# Patient Record
Sex: Male | Born: 1945
Health system: Southern US, Community
[De-identification: ages and names within clinical notes are randomized; demographics above are authoritative.]

## PROBLEM LIST (undated history)

## (undated) DIAGNOSIS — I10 Essential (primary) hypertension: Secondary | ICD-10-CM

## (undated) DIAGNOSIS — E785 Hyperlipidemia, unspecified: Secondary | ICD-10-CM

## (undated) DIAGNOSIS — E7800 Pure hypercholesterolemia, unspecified: Secondary | ICD-10-CM

## (undated) DIAGNOSIS — I2510 Atherosclerotic heart disease of native coronary artery without angina pectoris: Secondary | ICD-10-CM

## (undated) DIAGNOSIS — E876 Hypokalemia: Secondary | ICD-10-CM

## (undated) DIAGNOSIS — I213 ST elevation (STEMI) myocardial infarction of unspecified site: Secondary | ICD-10-CM

## (undated) DIAGNOSIS — I255 Ischemic cardiomyopathy: Secondary | ICD-10-CM

## (undated) DIAGNOSIS — Z8619 Personal history of other infectious and parasitic diseases: Secondary | ICD-10-CM

## (undated) HISTORY — DX: Ischemic cardiomyopathy: I25.5

## (undated) HISTORY — PX: CORONARY ANGIOPLASTY WITH STENT PLACEMENT: SHX49

## (undated) HISTORY — DX: Personal history of other infectious and parasitic diseases: Z86.19

## (undated) HISTORY — DX: ST elevation (STEMI) myocardial infarction of unspecified site: I21.3

## (undated) HISTORY — DX: Hyperlipidemia, unspecified: E78.5

## (undated) HISTORY — DX: Hypokalemia: E87.6

---

## 2011-12-17 DIAGNOSIS — I2510 Atherosclerotic heart disease of native coronary artery without angina pectoris: Secondary | ICD-10-CM | POA: Insufficient documentation

## 2011-12-17 DIAGNOSIS — M1990 Unspecified osteoarthritis, unspecified site: Secondary | ICD-10-CM | POA: Insufficient documentation

## 2011-12-17 HISTORY — DX: Atherosclerotic heart disease of native coronary artery without angina pectoris: I25.10

## 2011-12-17 HISTORY — DX: Unspecified osteoarthritis, unspecified site: M19.90

## 2015-10-24 DIAGNOSIS — L309 Dermatitis, unspecified: Secondary | ICD-10-CM

## 2015-10-24 DIAGNOSIS — I10 Essential (primary) hypertension: Secondary | ICD-10-CM

## 2015-10-24 HISTORY — DX: Essential (primary) hypertension: I10

## 2015-10-24 HISTORY — DX: Dermatitis, unspecified: L30.9

## 2016-05-07 DIAGNOSIS — M25562 Pain in left knee: Secondary | ICD-10-CM

## 2016-05-07 HISTORY — DX: Pain in left knee: M25.562

## 2016-06-18 DIAGNOSIS — E785 Hyperlipidemia, unspecified: Secondary | ICD-10-CM

## 2016-06-18 HISTORY — DX: Hyperlipidemia, unspecified: E78.5

## 2017-02-06 ENCOUNTER — Emergency Department (HOSPITAL_BASED_OUTPATIENT_CLINIC_OR_DEPARTMENT_OTHER): Payer: Medicare Other

## 2017-02-06 ENCOUNTER — Inpatient Hospital Stay (HOSPITAL_BASED_OUTPATIENT_CLINIC_OR_DEPARTMENT_OTHER)
Admission: EM | Admit: 2017-02-06 | Discharge: 2017-02-09 | DRG: 250 | Disposition: A | Discharge status: Home / Self Care | Payer: Medicare Other | Attending: Cardiovascular Disease | Admitting: Cardiovascular Disease

## 2017-02-06 ENCOUNTER — Encounter (HOSPITAL_BASED_OUTPATIENT_CLINIC_OR_DEPARTMENT_OTHER): Payer: Self-pay | Admitting: *Deleted

## 2017-02-06 ENCOUNTER — Encounter (HOSPITAL_COMMUNITY)
Admission: EM | Disposition: A | Discharge status: Home / Self Care | Payer: Self-pay | Source: Home / Self Care | Attending: Cardiovascular Disease

## 2017-02-06 DIAGNOSIS — I110 Hypertensive heart disease with heart failure: Secondary | ICD-10-CM | POA: Diagnosis present

## 2017-02-06 DIAGNOSIS — E876 Hypokalemia: Secondary | ICD-10-CM | POA: Diagnosis present

## 2017-02-06 DIAGNOSIS — I2109 ST elevation (STEMI) myocardial infarction involving other coronary artery of anterior wall: Secondary | ICD-10-CM

## 2017-02-06 DIAGNOSIS — I2510 Atherosclerotic heart disease of native coronary artery without angina pectoris: Secondary | ICD-10-CM | POA: Diagnosis present

## 2017-02-06 DIAGNOSIS — Z87891 Personal history of nicotine dependence: Secondary | ICD-10-CM

## 2017-02-06 DIAGNOSIS — I252 Old myocardial infarction: Secondary | ICD-10-CM | POA: Diagnosis not present

## 2017-02-06 DIAGNOSIS — Z9861 Coronary angioplasty status: Secondary | ICD-10-CM

## 2017-02-06 DIAGNOSIS — I2102 ST elevation (STEMI) myocardial infarction involving left anterior descending coronary artery: Principal | ICD-10-CM

## 2017-02-06 DIAGNOSIS — I255 Ischemic cardiomyopathy: Secondary | ICD-10-CM

## 2017-02-06 DIAGNOSIS — I213 ST elevation (STEMI) myocardial infarction of unspecified site: Secondary | ICD-10-CM

## 2017-02-06 DIAGNOSIS — E7800 Pure hypercholesterolemia, unspecified: Secondary | ICD-10-CM | POA: Diagnosis present

## 2017-02-06 DIAGNOSIS — T82855A Stenosis of coronary artery stent, initial encounter: Secondary | ICD-10-CM | POA: Diagnosis present

## 2017-02-06 DIAGNOSIS — E785 Hyperlipidemia, unspecified: Secondary | ICD-10-CM | POA: Diagnosis present

## 2017-02-06 DIAGNOSIS — Y831 Surgical operation with implant of artificial internal device as the cause of abnormal reaction of the patient, or of later complication, without mention of misadventure at the time of the procedure: Secondary | ICD-10-CM | POA: Diagnosis present

## 2017-02-06 DIAGNOSIS — I5021 Acute systolic (congestive) heart failure: Secondary | ICD-10-CM | POA: Diagnosis present

## 2017-02-06 DIAGNOSIS — Z79899 Other long term (current) drug therapy: Secondary | ICD-10-CM

## 2017-02-06 DIAGNOSIS — R079 Chest pain, unspecified: Secondary | ICD-10-CM | POA: Diagnosis not present

## 2017-02-06 DIAGNOSIS — I360 Nonrheumatic tricuspid (valve) stenosis: Secondary | ICD-10-CM | POA: Diagnosis not present

## 2017-02-06 HISTORY — DX: Essential (primary) hypertension: I10

## 2017-02-06 HISTORY — PX: CORONARY BALLOON ANGIOPLASTY: CATH118233

## 2017-02-06 HISTORY — DX: Atherosclerotic heart disease of native coronary artery without angina pectoris: I25.10

## 2017-02-06 HISTORY — DX: Pure hypercholesterolemia, unspecified: E78.00

## 2017-02-06 HISTORY — DX: ST elevation (STEMI) myocardial infarction involving other coronary artery of anterior wall: I21.09

## 2017-02-06 HISTORY — PX: LEFT HEART CATH AND CORONARY ANGIOGRAPHY: CATH118249

## 2017-02-06 LAB — TROPONIN I: Troponin I: 0.06 ng/mL (ref <0.03)

## 2017-02-06 LAB — CBC
HCT: 43.1 % (ref 39.0–52.0)
Hemoglobin: 14.6 g/dL (ref 13.0–17.0)
MCH: 31.5 pg (ref 26.0–34.0)
MCHC: 33.9 g/dL (ref 30.0–36.0)
MCV: 93.1 fL (ref 78.0–100.0)
PLATELETS: 198 10*3/uL (ref 150–400)
RBC: 4.63 MIL/uL (ref 4.22–5.81)
RDW: 13.6 % (ref 11.5–15.5)
WBC: 6 10*3/uL (ref 4.0–10.5)

## 2017-02-06 LAB — POCT ACTIVATED CLOTTING TIME: ACTIVATED CLOTTING TIME: 488 s

## 2017-02-06 LAB — CARDIAC CATHETERIZATION: CATHEFQUANT: 30 %

## 2017-02-06 LAB — BASIC METABOLIC PANEL
Anion gap: 7 (ref 5–15)
BUN: 18 mg/dL (ref 6–20)
CO2: 26 mmol/L (ref 22–32)
CREATININE: 0.78 mg/dL (ref 0.61–1.24)
Calcium: 9.1 mg/dL (ref 8.9–10.3)
Chloride: 106 mmol/L (ref 101–111)
Glucose, Bld: 118 mg/dL — ABNORMAL HIGH (ref 65–99)
POTASSIUM: 3.6 mmol/L (ref 3.5–5.1)
SODIUM: 139 mmol/L (ref 135–145)

## 2017-02-06 SURGERY — LEFT HEART CATH AND CORONARY ANGIOGRAPHY
Anesthesia: LOCAL

## 2017-02-06 MED ORDER — NITROGLYCERIN IN D5W 200-5 MCG/ML-% IV SOLN
0.0000 ug/min | INTRAVENOUS | Status: DC
  Administered 2017-02-06: 10 ug/min via INTRAVENOUS
  Filled 2017-02-06: qty 250

## 2017-02-06 MED ORDER — ATORVASTATIN CALCIUM 80 MG PO TABS
80.0000 mg | ORAL_TABLET | Freq: Every day | ORAL | Status: DC
  Administered 2017-02-06 – 2017-02-08 (×3): 80 mg via ORAL
  Filled 2017-02-06 (×3): qty 1

## 2017-02-06 MED ORDER — HEPARIN SODIUM (PORCINE) 5000 UNIT/ML IJ SOLN
4000.0000 [IU] | Freq: Once | INTRAMUSCULAR | Status: AC
  Filled 2017-02-06: qty 1

## 2017-02-06 MED ORDER — TICAGRELOR 90 MG PO TABS
90.0000 mg | ORAL_TABLET | Freq: Two times a day (BID) | ORAL | Status: DC
  Administered 2017-02-06 – 2017-02-09 (×6): 90 mg via ORAL
  Filled 2017-02-06 (×6): qty 1

## 2017-02-06 MED ORDER — ACETAMINOPHEN 325 MG PO TABS: 650.0000 mg | ORAL_TABLET | ORAL | Status: DC | PRN

## 2017-02-06 MED ORDER — HEPARIN (PORCINE) IN NACL 2-0.9 UNIT/ML-% IJ SOLN
INTRAMUSCULAR | Status: AC
  Filled 2017-02-06: qty 1000

## 2017-02-06 MED ORDER — CARVEDILOL 6.25 MG PO TABS
6.2500 mg | ORAL_TABLET | Freq: Two times a day (BID) | ORAL | Status: DC
  Administered 2017-02-07 (×2): 6.25 mg via ORAL
  Filled 2017-02-06 (×3): qty 1

## 2017-02-06 MED ORDER — IOPAMIDOL (ISOVUE-370) INJECTION 76%
INTRAVENOUS | Status: DC | PRN
  Administered 2017-02-06: 125 mL via INTRA_ARTERIAL

## 2017-02-06 MED ORDER — ASPIRIN 81 MG PO CHEW
324.0000 mg | CHEWABLE_TABLET | Freq: Once | ORAL | Status: AC
  Administered 2017-02-06: 324 mg via ORAL
  Filled 2017-02-06: qty 4

## 2017-02-06 MED ORDER — SODIUM CHLORIDE 0.9% FLUSH: 3.0000 mL | INTRAVENOUS | Status: DC | PRN

## 2017-02-06 MED ORDER — ENOXAPARIN SODIUM 40 MG/0.4ML ~~LOC~~ SOLN
40.0000 mg | SUBCUTANEOUS | Status: DC
  Administered 2017-02-07 – 2017-02-08 (×2): 40 mg via SUBCUTANEOUS
  Filled 2017-02-06 (×2): qty 0.4

## 2017-02-06 MED ORDER — NITROGLYCERIN 1 MG/10 ML FOR IR/CATH LAB
INTRA_ARTERIAL | Status: AC
  Filled 2017-02-06: qty 10

## 2017-02-06 MED ORDER — NITROGLYCERIN 1 MG/10 ML FOR IR/CATH LAB
INTRA_ARTERIAL | Status: DC | PRN
  Administered 2017-02-06: 150 ug via INTRACORONARY
  Administered 2017-02-06: 300 ug via INTRACORONARY

## 2017-02-06 MED ORDER — TICAGRELOR 90 MG PO TABS
ORAL_TABLET | ORAL | Status: AC
  Filled 2017-02-06: qty 1

## 2017-02-06 MED ORDER — LIDOCAINE HCL 1 % IJ SOLN
INTRAMUSCULAR | Status: AC
  Filled 2017-02-06: qty 20

## 2017-02-06 MED ORDER — HEPARIN (PORCINE) IN NACL 2-0.9 UNIT/ML-% IJ SOLN
INTRAMUSCULAR | Status: DC | PRN
  Administered 2017-02-06: 10 mL via INTRA_ARTERIAL

## 2017-02-06 MED ORDER — HEPARIN SODIUM (PORCINE) 1000 UNIT/ML IJ SOLN
INTRAMUSCULAR | Status: AC
  Filled 2017-02-06: qty 1

## 2017-02-06 MED ORDER — HEPARIN (PORCINE) IN NACL 100-0.45 UNIT/ML-% IJ SOLN
14.0000 [IU]/kg/h | Freq: Once | INTRAMUSCULAR | Status: AC
Start: 2017-02-06 — End: 2017-02-06
  Administered 2017-02-06: 14 [IU]/kg/h via INTRAVENOUS
  Filled 2017-02-06: qty 250

## 2017-02-06 MED ORDER — SODIUM CHLORIDE 0.9 % IV SOLN: 250.0000 mL | INTRAVENOUS | Status: DC | PRN

## 2017-02-06 MED ORDER — SODIUM CHLORIDE 0.9 % IV SOLN
INTRAVENOUS | Status: DC
  Administered 2017-02-06: 12:00:00 via INTRAVENOUS

## 2017-02-06 MED ORDER — RAMIPRIL 5 MG PO CAPS
5.0000 mg | ORAL_CAPSULE | Freq: Every day | ORAL | Status: DC
  Administered 2017-02-07 – 2017-02-09 (×3): 5 mg via ORAL
  Filled 2017-02-06 (×3): qty 1

## 2017-02-06 MED ORDER — HEPARIN SODIUM (PORCINE) 1000 UNIT/ML IJ SOLN
INTRAMUSCULAR | Status: DC | PRN
  Administered 2017-02-06: 5000 [IU] via INTRAVENOUS

## 2017-02-06 MED ORDER — LIDOCAINE HCL (PF) 1 % IJ SOLN
INTRAMUSCULAR | Status: DC | PRN
  Administered 2017-02-06: 2 mL

## 2017-02-06 MED ORDER — ASPIRIN 81 MG PO CHEW
81.0000 mg | CHEWABLE_TABLET | Freq: Every day | ORAL | Status: DC
  Administered 2017-02-07 – 2017-02-09 (×3): 81 mg via ORAL
  Filled 2017-02-06 (×3): qty 1

## 2017-02-06 MED ORDER — ONDANSETRON HCL 4 MG/2ML IJ SOLN: 4.0000 mg | Freq: Four times a day (QID) | INTRAMUSCULAR | Status: DC | PRN

## 2017-02-06 MED ORDER — IOPAMIDOL (ISOVUE-370) INJECTION 76%
INTRAVENOUS | Status: AC
  Filled 2017-02-06: qty 100

## 2017-02-06 MED ORDER — TICAGRELOR 90 MG PO TABS
ORAL_TABLET | ORAL | Status: DC | PRN
  Administered 2017-02-06: 180 mg via ORAL

## 2017-02-06 MED ORDER — HEPARIN (PORCINE) IN NACL 2-0.9 UNIT/ML-% IJ SOLN
INTRAMUSCULAR | Status: AC | PRN
  Administered 2017-02-06: 1000 mL

## 2017-02-06 MED ORDER — VERAPAMIL HCL 2.5 MG/ML IV SOLN
INTRAVENOUS | Status: AC
  Filled 2017-02-06: qty 2

## 2017-02-06 MED ORDER — SODIUM CHLORIDE 0.9% FLUSH
3.0000 mL | Freq: Two times a day (BID) | INTRAVENOUS | Status: DC
  Administered 2017-02-07 – 2017-02-08 (×3): 3 mL via INTRAVENOUS

## 2017-02-06 MED ORDER — HEPARIN (PORCINE) IN NACL 100-0.45 UNIT/ML-% IJ SOLN: 1200.0000 [IU]/h | INTRAMUSCULAR | Status: DC

## 2017-02-06 MED ORDER — SODIUM CHLORIDE 0.9 % IV SOLN
INTRAVENOUS | Status: AC
  Administered 2017-02-06: 100 mL/h via INTRAVENOUS

## 2017-02-06 SURGICAL SUPPLY — 16 items
BALLN EUPHORA RX 2.0X12 (BALLOONS) ×2
BALLOON EUPHORA RX 2.0X12 (BALLOONS) ×1 IMPLANT
CATH HEARTRAIL 6F IL4.0 (CATHETERS) ×2 IMPLANT
CATH INFINITI 5FR ANG PIGTAIL (CATHETERS) ×2 IMPLANT
CATH INFINITI JR4 5F (CATHETERS) ×2 IMPLANT
DEVICE RAD COMP TR BAND LRG (VASCULAR PRODUCTS) ×2 IMPLANT
GLIDESHEATH SLEND SS 6F .021 (SHEATH) ×2 IMPLANT
GUIDEWIRE INQWIRE 1.5J.035X260 (WIRE) ×1 IMPLANT
INQWIRE 1.5J .035X260CM (WIRE) ×2
KIT ENCORE 26 ADVANTAGE (KITS) ×2 IMPLANT
KIT HEART LEFT (KITS) ×2 IMPLANT
PACK CARDIAC CATHETERIZATION (CUSTOM PROCEDURE TRAY) ×2 IMPLANT
SYR MEDRAD MARK V 150ML (SYRINGE) ×2 IMPLANT
TRANSDUCER W/STOPCOCK (MISCELLANEOUS) ×2 IMPLANT
TUBING CIL FLEX 10 FLL-RA (TUBING) ×2 IMPLANT
WIRE RUNTHROUGH .014X180CM (WIRE) ×2 IMPLANT

## 2017-02-06 NOTE — Progress Notes (Signed)
ANTICOAGULATION CONSULT NOTE - Initial Consult  Pharmacy Consult for heparin Indication: chest pain/ACS  No Known Allergies  Patient Measurements: Height: 5\' 9"  (175.3 cm) Weight: 190 lb (86.2 kg) IBW/kg (Calculated) : 70.7 Heparin Dosing Weight: 86.2kg  Vital Signs: Temp: 98.1 F (36.7 C) (06/13 1047) Temp Source: Oral (06/13 1047) BP: 199/95 (06/13 1150) Pulse Rate: 63 (06/13 1150)  Labs:  Recent Labs  02/06/17 1050  HGB 14.6  HCT 43.1  PLT 198  CREATININE 0.78  TROPONINI 0.06*    Estimated Creatinine Clearance: 96.1 mL/min (by C-G formula based on SCr of 0.78 mg/dL).   Medical History: Past Medical History:  Diagnosis Date  . Hypercholesteremia   . Hypertension   . MI (myocardial infarction) (HCC)     Medications:  Infusions:  . sodium chloride 75 mL/hr at 02/06/17 1200  . heparin      Assessment: [age] yom presented to the ED with CP. Started on IV heparin per MD and [facility]. Baseline CBC is WNL and he is not on anticoagulation PTA.   Goal of Therapy:  Heparin level 0.3-0.7 units/ml Monitor platelets by anticoagulation protocol: Yes   Plan:  Heparin bolus 4000 units IV x 1 per MD Heparin gtt 1200 units/hr per MD F/u cath vs order 6 hr heparin level  Ulysses Alper, Drake Leachachel Lynn 02/06/2017,12:15 PM

## 2017-02-06 NOTE — ED Notes (Addendum)
EKG completed and given to MD along with pt's presenting complaint and meds taken at home.

## 2017-02-06 NOTE — H&P (Signed)
History & Physical    Patient ID: Joseph Guerra MRN: [id number], DOB/AGE: May 31, 1946   Admit date: 02/06/2017  Primary Physician: Andreas Blower., MD Primary Cardiologist: New to [facility] - Followed by Dr. Bing Matter with [facility]   History of Present Illness    Joseph Guerra is a 71 y.o. male with past medical history of CAD (prior MI in 2001 with stents placed at that time), HTN, and HLD who presents to [facility] as a CODE STEMI.  He had been in his usual state of health until this morning when he developed acute chest discomfort while exercising. He denies any associated dyspnea, nausea, vomiting, or diaphoresis. He took 1 SL NTG with minimal improvement in his symptoms. He took an additional SL NTG around 10:00 and his symptoms persisted, therefore he went to [facility] for further evaluation.   CXR showed mild hyperinflation with no acute abnormalities. CBC with WBC of 6.0, Hgb 14.6, platelets 198. K+ 3.6. Creatinine 0.78. Initial troponin 0.06.  EKG with ST elevation along V4-V5, therefore CODE STEMI was activated and he was transferred to [facility] for an emergent cardiac catheterization. Given a Heparin bolus and started on Heparin drip prior to transfer. Upon arrival to [facility], he is without chest pain.   Past Medical History    Past Medical History:  Diagnosis Date  . CAD (coronary artery disease)    a. prior MI in 2001 with stents placed at that time (patient unsure of which arteries)  . Hypercholesteremia   . Hypertension     Past Surgical History:  Procedure Laterality Date  . CORONARY ANGIOPLASTY WITH STENT PLACEMENT       Allergies  No Known Allergies   Home Medications    Prior to Admission medications   Medication Sig Start Date End Date Taking? Authorizing Provider  atorvastatin (LIPITOR) 80 MG tablet Take 80 mg by mouth daily.   Yes [provider]  folic acid (FOLVITE) 400 MCG tablet Take 400 mcg by mouth daily.    Yes [provider]  metoprolol tartrate (LOPRESSOR) 25 MG tablet Take 25 mg by mouth 1 day or 1 dose.   Yes [provider]  nitroGLYCERIN (NITROSTAT) 0.3 MG SL tablet Place 0.3 mg under the tongue every 5 (five) minutes as needed for chest pain.   Yes [provider]  ramipril (ALTACE) 5 MG capsule Take 5 mg by mouth daily.   Yes [provider]    Family History    No family history on file. - Not obtained due to the acute nature of this encounter.   Social History    Social History   Social History  . Marital status: Married    Spouse name: N/A  . Number of children: N/A  . Years of education: N/A   Occupational History  . Not on file.   Social History Main Topics  . Smoking status: Former Games developer  . Smokeless tobacco: Never Used  . Alcohol use Yes     Comment: occ  . Drug use: No  . Sexual activity: Not on file   Other Topics Concern  . Not on file   Social History Narrative  . No narrative on file     Review of Systems    General:  No chills, fever, night sweats or weight changes.  Cardiovascular:  No dyspnea on exertion, edema, orthopnea, palpitations, paroxysmal nocturnal dyspnea. Positive for chest pain.  Dermatological: No rash, lesions/masses Respiratory:  No cough, dyspnea Urologic: No hematuria, dysuria Abdominal:   No nausea, vomiting, diarrhea, bright red blood per rectum, melena, or hematemesis Neurologic:  No visual changes, wkns, changes in mental status. All other systems reviewed and are otherwise negative except as noted above.  Physical Exam    Blood pressure (!) 164/88, pulse (!) 59, temperature 98.1 F (36.7 C), temperature source Oral, resp. rate (!) 25, height 5\' 9"  (1.753 m), weight 190 lb (86.2 kg), SpO2 98 %.  General: Well developed, well nourished,male in no acute distress. Head: Normocephalic, atraumatic, sclera non-icteric, no xanthomas, nares are without discharge. Dentition:  Neck: No carotid  bruits. JVD not elevated.  Lungs: Respirations regular and unlabored, without wheezes or rales.  Heart: Regular rate and rhythm. No S3 or S4.  No murmur, no rubs, or gallops appreciated. Abdomen: Soft, non-tender, non-distended with normoactive bowel sounds. No hepatomegaly. No rebound/guarding. No obvious abdominal masses. Msk:  Strength and tone appear normal for age. No joint deformities or effusions. Extremities: No clubbing or cyanosis. No edema.  Distal pedal pulses are 2+ bilaterally. Neuro: Alert and oriented X 3. Moves all extremities spontaneously. No focal deficits noted. Psych:  Responds to questions appropriately with a normal affect. Skin: No rashes or lesions noted  Labs    Troponin (Point of Care Test) No results for input(s): TROPIPOC in the last 72 hours.  Recent Labs  02/06/17 1050  TROPONINI 0.06*   Lab Results  Component Value Date   WBC 6.0 02/06/2017   HGB 14.6 02/06/2017   HCT 43.1 02/06/2017   MCV 93.1 02/06/2017   PLT 198 02/06/2017     Recent Labs Lab 02/06/17 1050  NA 139  K 3.6  CL 106  CO2 26  BUN 18  CREATININE 0.78  CALCIUM 9.1  GLUCOSE 118*   No results found for: CHOL, HDL, LDLCALC, TRIG No results found for: DDIMER  No results found for: BNP No results found for: PROBNP No results for input(s): INR in the last 72 hours.    Radiology Studies    Dg Chest 2 View  Result Date: 02/06/2017 CLINICAL DATA:  Onset of chest tightness this morning. Former smoker. History of previous MI. EXAM: CHEST  2 VIEW COMPARISON:  None in PACs FINDINGS: The lungs are well-expanded with mild hemidiaphragm flattening. The heart and pulmonary vascularity are normal. The mediastinum is normal in width. There is no pleural effusion or pneumothorax. There is multilevel degenerative disc disease of the thoracic spine. IMPRESSION: Mild hyperinflation may be voluntary or may reflect the patient's smoking history. No pneumonia, pulmonary edema, nor other acute  cardiopulmonary abnormality. Thoracic spine osteoarthritis. Electronically Signed   By: David  Swazil[place] M.D.   On: 02/06/2017 11:12    EKG & Cardiac Imaging    EKG: NSR, HR 60, with 1mm ST elevation along V4 and V5. - Personally Reviewed  ECHOCARDIOGRAM: None on File  Assessment & Plan    1. Anterolateral STEMI - the patient has a known history of CAD with a prior MI in 2001 (stents placed at that time but he is unsure of which arteries these were placed).  - he developed acute chest discomfort while exercising this morning with no associated dyspnea, nausea, vomiting, or diaphoresis. Minimal relief with initial SL NTG but he is chest pain free upon arrival to [facility].  - initial EKG shows ST elevation along V4-V5, therefore CODE STEMI was activated and he was transferred to [facility] for an emergent cardiac catheterization. Given a Heparin  bolus and started on Heparin drip prior to transfer.  Continue statin and BB therapy. Further recommendations pending his cath results.   2. HLD - on Atorvastatin 80mg  daily as an outpatient. Continue with high-intensity statin therapy.  - recheck FLP.  3. HTN - PTA medications include Lopressor 25mg  BID and Ramipril 5mg  daily.  - continue Lopressor. Hold Ramipril today in the setting of his cardiac catheterization.   Signed, Ellsworth LennoxBrittany M Strader, PA-C 02/06/2017, 1:17 PM Pager: [phone]   Agree with assessment and plan.  I had [facility] and reviewed ECG and recommended STEMI activation and [facility]. Pt has a h/o CAD and remotely had suffered an MI and underwent stenting to his RCA and LAD.  He has been followed by Dr. Bing MatterKrasowski.  Today while exercisng he developed acute chest tightness which persisted. He ultimately presented ~ 2 hrs from CP onset [facility]. ECG reveals NSR with early anterolateral ST elevation in V4-5 with T wave inversion inferiorly. Code STEMI was  activated, recommended iv heparin and NTG.  Plan emergent cath. Pt was on atorvastatin at 40 mg as outpatient.  Will need to be very aggressive with LDL lowering. Reportedly had an echo earlier this year.    Lennette Biharihomas A. Kelly, MD, [facility] 02/06/2017

## 2017-02-06 NOTE — ED Provider Notes (Signed)
[facility] Provider Note   CSN: [id number] Arrival date & time: 02/06/17  1037     History   Chief Complaint Chief Complaint  Patient presents with  . Chest Pain    HPI Joseph Guerra is a 71 y.o. male.  Patient with onset of chest pain this morning at 8:00 substernal some radiation into both arms. Worse pain was 5 out of 10. Patient took 3 nitroglycerin and took his baby aspirin a takes daily some improvement in the pain. Upon arrival here pain was down to 2 out of 10. Patient status post stent and 2002. No longer followed by cardiology. Not followed by cardiology in this area. Patient also has a history of hypertension and high cholesterol. No nausea no vomiting no shortness of breath. No near syncopal episodes.      Past Medical History:  Diagnosis Date  . Hypercholesteremia   . Hypertension   . MI (myocardial infarction) (HCC)     There are no active problems to display for this patient.   Past Surgical History:  Procedure Laterality Date  . CORONARY ANGIOPLASTY WITH STENT PLACEMENT         Home Medications    Prior to Admission medications   Medication Sig Start Date End Date Taking? Authorizing Provider  atorvastatin (LIPITOR) 80 MG tablet Take 80 mg by mouth daily.   Yes [provider]  folic acid (FOLVITE) 400 MCG tablet Take 400 mcg by mouth daily.   Yes [provider]  metoprolol tartrate (LOPRESSOR) 25 MG tablet Take 25 mg by mouth 1 day or 1 dose.   Yes [provider]  nitroGLYCERIN (NITROSTAT) 0.3 MG SL tablet Place 0.3 mg under the tongue every 5 (five) minutes as needed for chest pain.   Yes [provider]  ramipril (ALTACE) 5 MG capsule Take 5 mg by mouth daily.   Yes [provider]    Family History No family history on file.  Social History Social History  Substance Use Topics  . Smoking status: Former Games developer  . Smokeless tobacco: Never Used  . Alcohol use Yes   Comment: occ     Allergies   Patient has no known allergies.   Review of Systems Review of Systems  Constitutional: Negative for fever.  HENT: Negative for congestion.   Eyes: Negative for redness.  Respiratory: Negative for shortness of breath.   Cardiovascular: Positive for chest pain. Negative for leg swelling.  Gastrointestinal: Negative for abdominal pain, blood in stool, nausea and vomiting.  Genitourinary: Negative for hematuria.  Musculoskeletal: Negative for back pain.  Skin: Negative for rash.  Neurological: Negative for syncope and headaches.  Hematological: Does not bruise/bleed easily.  Psychiatric/Behavioral: Negative for confusion.     Physical Exam Updated Vital Signs BP (!) 199/95   Pulse 63   Temp 98.1 F (36.7 C) (Oral)   Resp 15   Ht 1.753 m (5\' 9" )   Wt 86.2 kg (190 lb)   SpO2 97%   BMI 28.06 kg/m   Physical Exam  Constitutional: He is oriented to person, place, and time. He appears well-developed and well-nourished. No distress.  HENT:  Head: Normocephalic and atraumatic.  Mouth/Throat: Oropharynx is clear and moist.  Eyes: Conjunctivae and EOM are normal. Pupils are equal, round, and reactive to light.  Neck: Normal range of motion. Neck supple.  Cardiovascular: Normal rate, regular rhythm and normal heart sounds.   Pulmonary/Chest: Effort normal and breath sounds normal. No respiratory distress.  Abdominal:  Soft. Bowel sounds are normal. There is no tenderness.  Musculoskeletal: Normal range of motion. He exhibits no edema.  Neurological: He is alert and oriented to person, place, and time. No cranial nerve deficit or sensory deficit. He exhibits normal muscle tone. Coordination normal.  Skin: Skin is warm.  Nursing note and vitals reviewed.    ED Treatments / Results  Labs (all labs ordered are listed, but only abnormal results are displayed) Labs Reviewed  BASIC METABOLIC PANEL - Abnormal; Notable for the following:       Result  Value   Glucose, Bld 118 (*)    All other components within normal limits  TROPONIN I - Abnormal; Notable for the following:    Troponin I 0.06 (*)    All other components within normal limits  CBC    EKG  EKG Interpretation  Date/Time:  Wednesday February 06 2017 11:41:36 EDT Ventricular Rate:  60 PR Interval:    QRS Duration: 105 QT Interval:  433 QTC Calculation: 433 R Axis:   93 Text Interpretation:  Sinus rhythm Borderline repolarization abnormality ST elevation, consider anterolateral injury No significant change since last tracing Confirmed by Vanetta Mulders [phone]) on 02/06/2017 11:51:23 AM       Radiology Dg Chest 2 View  Result Date: 02/06/2017 CLINICAL DATA:  Onset of chest tightness this morning. Former smoker. History of previous MI. EXAM: CHEST  2 VIEW COMPARISON:  None in PACs FINDINGS: The lungs are well-expanded with mild hemidiaphragm flattening. The heart and pulmonary vascularity are normal. The mediastinum is normal in width. There is no pleural effusion or pneumothorax. There is multilevel degenerative disc disease of the thoracic spine. IMPRESSION: Mild hyperinflation may be voluntary or may reflect the patient's smoking history. No pneumonia, pulmonary edema, nor other acute cardiopulmonary abnormality. Thoracic spine osteoarthritis. Electronically Signed   By: David  [place]il[place] M.D.   On: 02/06/2017 11:12    Procedures Procedures (including critical care time)  CRITICAL CARE Performed by: Vanetta Mulders Total critical care time: 30 minutes Critical care time was exclusive of separately billable procedures and treating other patients. Critical care was necessary to treat or prevent imminent or life-threatening deterioration. Critical care was time spent personally by me on the following activities: development of treatment plan with patient and/or surrogate as well as nursing, discussions with consultants, evaluation of patient's response to treatment,  examination of patient, obtaining history from patient or surrogate, ordering and performing treatments and interventions, ordering and review of laboratory studies, ordering and review of radiographic studies, pulse oximetry and re-evaluation of patient's condition.   Medications Ordered in ED Medications  0.9 %  sodium chloride infusion ( Intravenous New Bag/Given 02/06/17 1200)  heparin ADULT infusion 100 units/mL (25000 units/210mL sodium chloride 0.45%) (not administered)  aspirin chewable tablet 324 mg (324 mg Oral Given 02/06/17 1142)  heparin injection 4,000 Units (0 Units Intravenous Return to [facility] 02/06/17 1203)  heparin ADULT infusion 100 units/mL (25000 units/266mL sodium chloride 0.45%) (14 Units/kg/hr  86.2 kg Intravenous Rate/Dose Change 02/06/17 1205)     Initial Impression / Assessment and Plan / ED Course  I have reviewed the triage vital signs and the nursing notes.  Pertinent labs & imaging results that were available during my care of the patient were reviewed by me and considered in my medical decision making (see chart for details).     Patient presenting with onset of chest pain at 8 this morning substernal radiating to both arms. At worst 5 out of 10 currently  2 out of 10. No other real symptoms. Patient with a stent in 2002 no history of any chest pain since then. Patient took 3 nitroglycerin at home does take a baby aspirin a day. Some relief of the pain but did not resolve.  EKG here initial one was some PVCs and some concern for anterolateral ST changes. Patient had 2 runs of V. tach here without any change in pressure or any mental status changes or any symptoms. That plus a slightly elevated troponin at 0.06 and repeated the 12-lead basically look the same as the first one but there were no PVCs and that one raise more concern for the V4 V5 leads possibly suggestive of a STEMI.  Discussed with Dr. Tresa EndoKelly on-call for cardiology we went over this situation decided  to call a code STEMI. Discussed with Dr. Herbie BaltimoreHarding who is the Cath Lab physician today. [facility]  Patient started on heparin here. Patient had the rest of his aspirin dose here. Patient states pain is less than 2 out of 10 currently. Vital signs are stable. No V. tach at the moment.  Final Clinical Impressions(s) / ED Diagnoses   Final diagnoses:  ST elevation myocardial infarction (STEMI), unspecified artery [facility](HCC)    New Prescriptions New Prescriptions   No medications on file     Vanetta MuldersZackowski, Duy Lemming, MD 02/06/17 1224

## 2017-02-06 NOTE — ED Triage Notes (Signed)
Pt reports mid-sternal chest pain around 0830 -- reports taking 1 Nitro with no relief; took second Nitro around 1015 with some relief. Denies sob, dizziness, nausea, other associated symptoms. Hx of MI.

## 2017-02-06 NOTE — ED Notes (Signed)
Patient transported to X-ray 

## 2017-02-06 NOTE — ED Notes (Signed)
ED Provider at bedside. 

## 2017-02-06 NOTE — ED Notes (Signed)
Dr Deretha EmoryZackowski called STEM at this time after talking to cardiology

## 2017-02-07 ENCOUNTER — Encounter (HOSPITAL_COMMUNITY): Payer: Self-pay | Admitting: Cardiovascular Disease

## 2017-02-07 ENCOUNTER — Inpatient Hospital Stay (HOSPITAL_COMMUNITY): Payer: Medicare Other

## 2017-02-07 ENCOUNTER — Other Ambulatory Visit (HOSPITAL_COMMUNITY): Payer: Medicare Other

## 2017-02-07 DIAGNOSIS — I360 Nonrheumatic tricuspid (valve) stenosis: Secondary | ICD-10-CM

## 2017-02-07 DIAGNOSIS — I255 Ischemic cardiomyopathy: Secondary | ICD-10-CM

## 2017-02-07 DIAGNOSIS — E785 Hyperlipidemia, unspecified: Secondary | ICD-10-CM

## 2017-02-07 DIAGNOSIS — E876 Hypokalemia: Secondary | ICD-10-CM

## 2017-02-07 LAB — BASIC METABOLIC PANEL
Anion gap: 6 (ref 5–15)
BUN: 10 mg/dL (ref 6–20)
CO2: 25 mmol/L (ref 22–32)
Calcium: 8.4 mg/dL — ABNORMAL LOW (ref 8.9–10.3)
Chloride: 107 mmol/L (ref 101–111)
Creatinine, Ser: 0.66 mg/dL (ref 0.61–1.24)
GFR calc Af Amer: >60 mL/min (ref >60)
GLUCOSE: 101 mg/dL — AB (ref 65–99)
POTASSIUM: 3.3 mmol/L — AB (ref 3.5–5.1)
Sodium: 138 mmol/L (ref 135–145)

## 2017-02-07 LAB — CBC
HEMATOCRIT: 38.8 % — AB (ref 39.0–52.0)
Hemoglobin: 12.7 g/dL — ABNORMAL LOW (ref 13.0–17.0)
MCH: 30.3 pg (ref 26.0–34.0)
MCHC: 32.7 g/dL (ref 30.0–36.0)
MCV: 92.6 fL (ref 78.0–100.0)
Platelets: 174 10*3/uL (ref 150–400)
RBC: 4.19 MIL/uL — ABNORMAL LOW (ref 4.22–5.81)
RDW: 13.6 % (ref 11.5–15.5)
WBC: 8.5 10*3/uL (ref 4.0–10.5)

## 2017-02-07 LAB — ECHOCARDIOGRAM COMPLETE
HEIGHTINCHES: 69 in
Weight: 3079.39 oz

## 2017-02-07 LAB — LIPID PANEL
Cholesterol: 137 mg/dL (ref 0–200)
HDL: 46 mg/dL (ref >40)
LDL CALC: 76 mg/dL (ref 0–99)
TRIGLYCERIDES: 74 mg/dL (ref <150)
Total CHOL/HDL Ratio: 3 RATIO
VLDL: 15 mg/dL (ref 0–40)

## 2017-02-07 LAB — MRSA PCR SCREENING: MRSA BY PCR: NEGATIVE

## 2017-02-07 MED ORDER — POTASSIUM CHLORIDE CRYS ER 20 MEQ PO TBCR
20.0000 meq | EXTENDED_RELEASE_TABLET | Freq: Once | ORAL | Status: AC
  Administered 2017-02-07: 20 meq via ORAL
  Filled 2017-02-07: qty 1

## 2017-02-07 MED ORDER — ISOSORBIDE MONONITRATE ER 30 MG PO TB24
30.0000 mg | ORAL_TABLET | Freq: Every day | ORAL | Status: DC
  Administered 2017-02-07 – 2017-02-09 (×3): 30 mg via ORAL
  Filled 2017-02-07 (×3): qty 1

## 2017-02-07 MED ORDER — SPIRONOLACTONE 25 MG PO TABS
12.5000 mg | ORAL_TABLET | Freq: Two times a day (BID) | ORAL | Status: DC
  Administered 2017-02-07 – 2017-02-09 (×5): 12.5 mg via ORAL
  Filled 2017-02-07 (×5): qty 1

## 2017-02-07 NOTE — Progress Notes (Signed)
Progress Note  Patient Name: Joseph Guerra Date of Encounter: 02/07/2017  Primary Cardiologist: Kas  Subjective   No recurrent chest pain; day 1 s/p  anterolateral STEMI  Inpatient Medications    Scheduled Meds: . aspirin  81 mg Oral Daily  . atorvastatin  80 mg Oral q1800  . carvedilol  6.25 mg Oral BID WC  . enoxaparin (LOVENOX) injection  40 mg Subcutaneous Q24H  . ramipril  5 mg Oral Daily  . sodium chloride flush  3 mL Intravenous Q12H  . ticagrelor  90 mg Oral BID   Continuous Infusions: . sodium chloride 75 mL/hr at 02/07/17 0600  . sodium chloride    . nitroGLYCERIN 50 mcg/min (02/07/17 0800)   PRN Meds: sodium chloride, acetaminophen, ondansetron (ZOFRAN) IV, sodium chloride flush   Vital Signs    Vitals:   02/07/17 0600 02/07/17 0700 02/07/17 0800 02/07/17 0903  BP: 127/62 122/60 (!) 125/59   Pulse: (!) 51 (!) 55 (!) 52   Resp: (!) 22 (!) 21 (!) 22   Temp:    97.8 F (36.6 C)  TempSrc:    Oral  SpO2: 95% 93% 95%   Weight: 192 lb 7.4 oz (87.3 kg)     Height:        Intake/Output Summary (Last 24 hours) at 02/07/17 0928 Last data filed at 02/07/17 0800  Gross per 24 hour  Intake           1535.6 ml  Output             1727 ml  Net           -191.4 ml    I/O since admission: -1914  Filed Weights   02/06/17 1056 02/07/17 0600  Weight: 190 lb (86.2 kg) 192 lb 7.4 oz (87.3 kg)    Telemetry    Sinus Bradycardia 55 - Personally Reviewed  ECG    02/07/2017 ECG (independently read by me): SB at 54; Evolving MI STT changes V4-6 with mild residual ST depression inferiorly; QTc 483  02/06/2017 ECG (independently read by me): NSR 60; STE V4-5 with T wave inversion inferiorly; PVC  Physical Exam   BP (!) 125/59   Pulse (!) 52   Temp 97.8 F (36.6 C) (Oral)   Resp (!) 22   Ht 5\' 9"  (1.753 m)   Wt 192 lb 7.4 oz (87.3 kg)   SpO2 95%   BMI 28.42 kg/m  General: Alert, oriented, no distress.  Skin: normal turgor, no rashes, warm and  dry HEENT: Normocephalic, atraumatic. Pupils equal round and reactive to light; sclera anicteric; extraocular muscles intact Nose without nasal septal hypertrophy Mouth/Parynx benign; Mallinpatti scale 3 Neck: No JVD, no carotid bruits; normal carotid upstroke Lungs: clear to ausculatation and percussion; no wheezing or rales Chest wall: without tenderness to palpitation Heart: PMI not displaced, RRR, s1 s2 normal, 1/6 systolic murmur, no diastolic murmur, no rubs, gallops, thrills, or heaves Abdomen: soft, nontender; no hepatosplenomehaly, BS+; abdominal aorta nontender and not dilated by palpation. Back: no CVA tenderness Pulses 2+; R radial cath site stable Musculoskeletal: full range of motion, normal strength, no joint deformities Extremities: no clubbing cyanosis or edema, Homan's sign negative  Neurologic: grossly nonfocal; Cranial nerves grossly wnl Psychologic: Normal mood and affect   Labs    Chemistry Recent Labs Lab 02/06/17 1050 02/07/17 0236  NA 139 138  K 3.6 3.3*  CL 106 107  CO2 26 25  GLUCOSE 118* 101*  BUN 18 10  CREATININE 0.78 0.66  CALCIUM 9.1 8.4*  GFRNONAA >60 >60  GFRAA >60 >60  ANIONGAP 7 6     Hematology Recent Labs Lab 02/06/17 1050 02/07/17 0236  WBC 6.0 8.5  RBC 4.63 4.19*  HGB 14.6 12.7*  HCT 43.1 38.8*  MCV 93.1 92.6  MCH 31.5 30.3  MCHC 33.9 32.7  RDW 13.6 13.6  PLT 198 174    Cardiac Enzymes Recent Labs Lab 02/06/17 1050  TROPONINI 0.06*   No results for input(s): TROPIPOC in the last 168 hours.   BNPNo results for input(s): BNP, PROBNP in the last 168 hours.   DDimer No results for input(s): DDIMER in the last 168 hours.   Lipid Panel     Component Value Date/Time   CHOL 137 02/07/2017 0236   TRIG 74 02/07/2017 0236   HDL 46 02/07/2017 0236   CHOLHDL 3.0 02/07/2017 0236   VLDL 15 02/07/2017 0236   LDLCALC 76 02/07/2017 0236   Radiology    Dg Chest 2 View  Result Date: 02/06/2017 CLINICAL DATA:  Onset of  chest tightness this morning. Former smoker. History of previous MI. EXAM: CHEST  2 VIEW COMPARISON:  None in PACs FINDINGS: The lungs are well-expanded with mild hemidiaphragm flattening. The heart and pulmonary vascularity are normal. The mediastinum is normal in width. There is no pleural effusion or pneumothorax. There is multilevel degenerative disc disease of the thoracic spine. IMPRESSION: Mild hyperinflation may be voluntary or may reflect the patient's smoking history. No pneumonia, pulmonary edema, nor other acute cardiopulmonary abnormality. Thoracic spine osteoarthritis. Electronically Signed   By: David  SwazilandJordan M.D.   On: 02/06/2017 11:12    Cardiac Studies   Conclusion     Prox Cx lesion, 50 %stenosed.  Dist LAD lesion, 100 %stenosed.  Post intervention, there is a 10% residual stenosis.  Mid LAD lesion, 40 %stenosed.  Prox RCA to Dist RCA lesion, 100 %stenosed.  The left ventricular systolic function is normal.  LV end diastolic pressure is normal.  There is trivial (1+) mitral regurgitation.   1. Severe two-vessel coronary artery disease with patent stent in the mid LAD with moderate in-stent restenosis. The RCA is a full metal jacket and chronically occluded with left-to-right collaterals. The culprit for STEMI is occluded distal LAD close to the apex.  2. Moderately to severely reduced LV systolic function with an EF of 30% with global hypokinesis and apical akinesis. Normal left ventricular end-diastolic pressure.  3. Successful balloon angioplasty of the distal LAD. The diameter is too small and too distal to place a stent.  Recommendations: Dual antiplatelet therapy for at least one year. Aggressive treatment of coronary artery disease and ischemic cardiomyopathy. I switched metoprolol to carvedilol. Uptitrate heart failure medications as tolerated and consider adding eplerenone before hospital discharge.      Patient Profile     71 y.o. male with known  CAD and prior stentint to RCA and proximal LAD transferred from [facility] with anterolateral STEMI.  Assessment & Plan    1. Anterior STEMI 2/2 distal occlusion of LAD, RX with PTCA alone (small vessel).  2. Multivessel CAD: see diagram, had prior RCA and LAD stents inserted in 2002. Total RCA stent with L>R collaterals, 50% Lcx, and 40% instent proximal LAD stenosis.  Will start imdur and wean and dc iv NTG.  On BB with carvedilol/ ASA/brillinta and ACE-I  3. Ischemic cardiomyopathy  EF acutely yesterday 30%.  Reportedly pt had an echo  earlier this  year; will try to obtain outside echo data.  May benefit from aldosterone blockade.  If LV remains low in future possible transition from ACE-I to ARNI with entresto.  4. Hyperlipidemia:  Was on atorvastatin 40 mg PTA; LDL today 76, atorv increased to 80 mg.   5. K 3.3; will replete  Signed, Lennette Bihari, MD, [facility] 02/07/2017, 9:28 AM

## 2017-02-07 NOTE — Progress Notes (Signed)
  Echocardiogram 2D Echocardiogram has been performed.  Joseph Guerra, Joseph Guerra 02/07/2017, 3:38 PM

## 2017-02-07 NOTE — Progress Notes (Signed)
Per [employer] stage managernsurance check for [employer]ilinta # 8  S/W  [place] @ [employer] # [phone]   1; BRILINTA   90 MG BID   CPVER- YES  CO-PAY- $ 37.00  TIET- 3 DRUG  PRIOR APPROVAL NO     PREFERRED PHARMACY : [facility]

## 2017-02-07 NOTE — Plan of Care (Signed)
Problem: Cardiovascular: Goal: Ability to achieve and maintain adequate cardiovascular perfusion will improve Outcome: Progressing Pt hemodynamics with-in normal limits on NTG gtt Goal: Vascular access site(s) Level 0-1 will be maintained Outcome: Progressing Vascular site level 0

## 2017-02-07 NOTE — Care Management Note (Signed)
Case Management Note Joseph PieriniKristi Joelynn Dust RN, BSN Unit 2W-Case Manager-- 2H coverage [phone]  Patient Details  Name: Joseph Guerra MRN: [id number] Date of Birth: Sep 21, 1945  Subjective/Objective:   Pt admitted with STEMI- note pt has been started on Brilinta                 Action/Plan: PTA pt lived at home with spouse- anticipate return home- have submitted insurance check for Brilinta coverage- copay cost $37/mo- info shared with pt and wife at the bedside- pt also given 30 day free card to use on discharge- per pt he uses [facility] in [place] on [address]- will need to call on d/c to check drug in stock.   Expected Discharge Date:                  Expected Discharge Plan:  Home/Self Care  In-House Referral:     Discharge planning Services  CM Consult, Medication Assistance  Post Acute Care Choice:    Choice offered to:     DME Arranged:    DME Agency:     HH Arranged:    HH Agency:     Status of Service:  In process, will continue to follow  If discussed at Long Length of Stay Meetings, dates discussed:    Discharge Disposition:   Additional Comments:  Darrold SpanWebster, [name], RN 02/07/2017, 2:46 PM

## 2017-02-07 NOTE — Progress Notes (Signed)
CARDIAC REHAB PHASE I   PRE:  Rate/Rhythm: 59 SB  BP:  Sitting: 116/60        SaO2: 95 RA  MODE:  Ambulation: 740 ft   POST:  Rate/Rhythm: 80 SR c/ PVCs  BP:  Sitting: 132/63         SaO2: 97 RA  Pt ambulated 740 ft on RA, IV, handheld assist, steady gait, tolerated well with no complaints. Completed MI/PCI education with pt, pt's wife and daughter at bedside.  Reviewed risk factors, MI book, anti-platelet therapy, activity restrictions, ntg, exercise, heart healthy diet and phase 2 cardiac rehab. Pt verbalized understanding, receptive to education. Pt agrees to phase 2 cardiac rehab referral, [facility]. Pt to see case manager regarding brilinta prior to discharge. Pt to recliner after walk, call bell within reach. Will follow up tomorrow.    [phone] Joylene GrapesEmily C Braycen Burandt, RN, BSN 02/07/2017 11:51 AM

## 2017-02-08 ENCOUNTER — Encounter (HOSPITAL_COMMUNITY)
Admission: EM | Disposition: A | Discharge status: Home / Self Care | Payer: Self-pay | Source: Home / Self Care | Attending: Cardiovascular Disease

## 2017-02-08 LAB — BASIC METABOLIC PANEL
ANION GAP: 5 (ref 5–15)
BUN: 12 mg/dL (ref 6–20)
CHLORIDE: 106 mmol/L (ref 101–111)
CO2: 26 mmol/L (ref 22–32)
Calcium: 8.9 mg/dL (ref 8.9–10.3)
Creatinine, Ser: 0.74 mg/dL (ref 0.61–1.24)
GFR calc non Af Amer: >60 mL/min (ref >60)
Glucose, Bld: 105 mg/dL — ABNORMAL HIGH (ref 65–99)
POTASSIUM: 3.9 mmol/L (ref 3.5–5.1)
SODIUM: 137 mmol/L (ref 135–145)

## 2017-02-08 LAB — TROPONIN I
TROPONIN I: 9.19 ng/mL — AB (ref <0.03)
TROPONIN I: 5.86 ng/mL — AB (ref <0.03)
TROPONIN I: 6.61 ng/mL — AB (ref <0.03)

## 2017-02-08 SURGERY — LEFT HEART CATH AND CORONARY ANGIOGRAPHY
Anesthesia: LOCAL

## 2017-02-08 MED ORDER — CARVEDILOL 6.25 MG PO TABS
9.3750 mg | ORAL_TABLET | Freq: Two times a day (BID) | ORAL | Status: DC
  Administered 2017-02-08 – 2017-02-09 (×3): 9.375 mg via ORAL
  Filled 2017-02-08 (×3): qty 1

## 2017-02-08 MED ORDER — CARVEDILOL 6.25 MG PO TABS: 9.3750 mg | ORAL_TABLET | Freq: Two times a day (BID) | ORAL | Status: DC

## 2017-02-08 NOTE — Progress Notes (Signed)
[facility]. Pt and wife oriented to room and equipment. VSS. Telemetry applied, CCMD notified. Pt denies needs at this time. Call bell within reach, will continue to monitor.   Leonidas Rombergaitlin S Bumbledare, RN

## 2017-02-08 NOTE — Progress Notes (Addendum)
Progress Note  Patient Name: Joseph Guerra Date of Encounter: 02/08/2017  Primary Cardiologist: Dr. Bing Matter   Day 2 s/p anterolateral STEMI  Subjective   No chest pain or dyspnea. Laying comfortably in bed. However EKG showed evolving STEMI.   Inpatient Medications    Scheduled Meds: . aspirin  81 mg Oral Daily  . atorvastatin  80 mg Oral q1800  . carvedilol  6.25 mg Oral BID WC  . enoxaparin (LOVENOX) injection  40 mg Subcutaneous Q24H  . isosorbide mononitrate  30 mg Oral Daily  . ramipril  5 mg Oral Daily  . sodium chloride flush  3 mL Intravenous Q12H  . spironolactone  12.5 mg Oral BID  . ticagrelor  90 mg Oral BID   Continuous Infusions: . sodium chloride     PRN Meds: sodium chloride, acetaminophen, ondansetron (ZOFRAN) IV, sodium chloride flush   Vital Signs    Vitals:   02/08/17 0400 02/08/17 0500 02/08/17 0600 02/08/17 0700  BP: (!) 163/78 (!) 159/78 (!) 165/80 (!) 160/80  Pulse: 62 (!) 55  (!) 56  Resp: 18 15 (!) 25 (!) 22  Temp:      TempSrc:      SpO2: 96% 94%  95%  Weight:      Height:        Intake/Output Summary (Last 24 hours) at 02/08/17 0713 Last data filed at 02/08/17 0600  Gross per 24 hour  Intake              975 ml  Output             1750 ml  Net             -775 ml   Filed Weights   02/06/17 1056 02/07/17 0600  Weight: 190 lb (86.2 kg) 192 lb 7.4 oz (87.3 kg)    Telemetry    Sinus rhythm with PACs and PVCs - Personally Reviewed  ECG    EKG showed diffuse st elevation anterior laterally with TWI- Personally Reviewed  Physical Exam   GEN: No acute distress.   Neck: No JVD Cardiac: RRR, no murmurs, rubs, or gallops.  Respiratory: Clear to auscultation bilaterally. GI: Soft, nontender, non-distended  MS: No edema; No deformity. Neuro:  Nonfocal  Psych: Normal affect   Labs    Chemistry Recent Labs Lab 02/06/17 1050 02/07/17 0236 02/08/17 0306  NA 139 138 137  K 3.6 3.3* 3.9  CL 106 107 106  CO2 26  25 26   GLUCOSE 118* 101* 105*  BUN 18 10 12   CREATININE 0.78 0.66 0.74  CALCIUM 9.1 8.4* 8.9  GFRNONAA >60 >60 >60  GFRAA >60 >60 >60  ANIONGAP 7 6 5      Hematology Recent Labs Lab 02/06/17 1050 02/07/17 0236  WBC 6.0 8.5  RBC 4.63 4.19*  HGB 14.6 12.7*  HCT 43.1 38.8*  MCV 93.1 92.6  MCH 31.5 30.3  MCHC 33.9 32.7  RDW 13.6 13.6  PLT 198 174    Cardiac Enzymes Recent Labs Lab 02/06/17 1050  TROPONINI 0.06*   No results for input(s): TROPIPOC in the last 168 hours.   BNPNo results for input(s): BNP, PROBNP in the last 168 hours.   DDimer No results for input(s): DDIMER in the last 168 hours.   Radiology    Dg Chest 2 View  Result Date: 02/06/2017 CLINICAL DATA:  Onset of chest tightness this morning. Former smoker. History of previous MI. EXAM: CHEST  2 VIEW COMPARISON:  None in PACs FINDINGS: The lungs are well-expanded with mild hemidiaphragm flattening. The heart and pulmonary vascularity are normal. The mediastinum is normal in width. There is no pleural effusion or pneumothorax. There is multilevel degenerative disc disease of the thoracic spine. IMPRESSION: Mild hyperinflation may be voluntary or may reflect the patient's smoking history. No pneumonia, pulmonary edema, nor other acute cardiopulmonary abnormality. Thoracic spine osteoarthritis. Electronically Signed   By: David  [place] M.D.   On: 02/06/2017 11:12    Cardiac Studies  Conclusion     Prox Cx lesion, 50 %stenosed.  Dist LAD lesion, 100 %stenosed.  Post intervention, there is a 10% residual stenosis.  Mid LAD lesion, 40 %stenosed.  Prox RCA to Dist RCA lesion, 100 %stenosed.  The left ventricular systolic function is normal.  LV end diastolic pressure is normal.  There is trivial (1+) mitral regurgitation.  1. Severe two-vessel coronary artery disease with patent stent in the mid LAD with moderate in-stent restenosis. The RCA is a full metal jacket and chronically occluded with  left-to-right collaterals. The culprit for STEMI is occluded distal LAD close to the apex.  2. Moderately to severely reduced LV systolic function with an EF of 30% with global hypokinesis and apical akinesis. Normal left ventricular end-diastolic pressure.  3. Successful balloon angioplasty of the distal LAD. The diameter is too small and too distal to place a stent.  Recommendations: Dual antiplatelet therapy for at least one year. Aggressive treatment of coronary artery disease and ischemic cardiomyopathy. I switched metoprolol to carvedilol. Uptitrate heart failure medications as tolerated and consider adding eplerenone before hospital discharge.      Echo 03/09/17 Study Conclusions  - Left ventricle: Distal septal and apical akinesis. The cavity   size was moderately dilated. Wall thickness was increased in a   pattern of mild LVH. The estimated ejection fraction was 40%.   Doppler parameters are consistent with both elevated ventricular   end-diastolic filling pressure and elevated left atrial filling   pressure. - Left atrium: The atrium was mildly dilated. - Atrial septum: No defect or patent foramen ovale was identified.  Patient Profile     71 y.o. male with known CAD and prior stentint to RCA and proximal LAD [facility] from [facility] with anterolateral STEMI.  Assessment & Plan    1. Anterior STEMI - Cath showed distal occlusion of LAD s/p PTCA only. The diameter is too small and too distal to place a stent. - Ambulated well yesterday. EKG this morning showed evolving STEMI. However he is asymptomatic. Case reviewed and discussed with Dr. Herbie Baltimore who also came by to see patient. Plan to continue to observe as lack of chest pain. Will get troponin trend. Heparin per Dr. Tresa Endo.   2. Multivessel CAD with prior RCA and LAD stent - detailed cath as above. Continue Imdur, BB, ACE, statin, ASA and Brillinta.   3. ICM/acute systolic CHF - EF of 30% by  cath. Continue current therapy. If LV remains low in future possible transition from ACE-I to ARNI with entresto.  4. HLD - 02/07/2017: Cholesterol 137; HDL 46; LDL Cholesterol 76; Triglycerides 74; VLDL 15  - continue statin   Signed, Bhagat,Bhavinkumar, PA  02/08/2017, 7:13 AM     Patient seen and examined. Agree with assessment and plan. Day 2 s/p STEMI with PTCA to distal LAD occlusion.  No recurrent chest pain.  ECG today independently read by me shows sinus rhythm with PVC and evolutionary anterolateral T wave changes from  initial STE on 6/13.  ECHO yesterday shows EF 40% with distal septal and apical akinesis, LVH with diastolic dysfunction by tissue doppler. EF improved from cath assessment of 30%. BP elevated at 160/80. Will titrate carvediol to 9.375 mg bid today. Continue ACE-I, nitrates, ASA/Brilinta. Spironolactone was added for aldosterone blockade post MI with reduced LV fxn  Hypokalemia resolved with supplementation.  Will transfer to telemetry today. Cardiac Rehab. Aim for dc tomorrow.   Lennette Biharihomas A. Kelly, MD, [facility] 02/08/2017 7:46 AM

## 2017-02-08 NOTE — Progress Notes (Signed)
CARDIAC REHAB PHASE I   PRE:  Rate/Rhythm: 66 SR  BP:  Sitting: 112/64        SaO2: 96 RA  MODE:  Ambulation: 890 ft   POST:  Rate/Rhythm: 66 SR  BP:  Sitting: 128/67         SaO2: 98 RA  Pt states he is feeling well, no complaints. Daughter at bedside, both states they have no questions regarding education. Pt ambulated 890 ft on RA, brisk, steady gait, tolerated well with no complaints. Encouraged additional ambulation as tolerated. Pt verbalized understanding. Pt to edge of bed per pt request after walk, call bell within reach.  [phone] Joylene GrapesEmily C Tarin Navarez, RN, BSN 02/08/2017 10:55 AM

## 2017-02-08 NOTE — Plan of Care (Signed)
Problem: Activity: Goal: Ability to return to baseline activity level will improve Outcome: Progressing Activity progressing well.  Problem: Cardiovascular: Goal: Ability to achieve and maintain adequate cardiovascular perfusion will improve Outcome: Progressing Within parameters Goal: Vascular access site(s) Level 0-1 will be maintained Outcome: Progressing Level 0  Problem: Education: Goal: Understanding of CV disease, CV risk reduction, and recovery process will improve Outcome: Progressing Education ongoing

## 2017-02-08 NOTE — Progress Notes (Signed)
Critical trop 9.19 called from lab. Pt denies CP. Expected value s/p cath. No new orders given.

## 2017-02-08 NOTE — Progress Notes (Signed)
Critical ECG value relayed to Cards PA. Spoke with, will review.  Currently chest pain free and VVS. Will continue to monitor. Modena JanskyKevin Zeniah Briney RN 2 Heart

## 2017-02-09 LAB — BASIC METABOLIC PANEL
Anion gap: 7 (ref 5–15)
BUN: 14 mg/dL (ref 6–20)
CALCIUM: 8.7 mg/dL — AB (ref 8.9–10.3)
CO2: 25 mmol/L (ref 22–32)
CREATININE: 0.76 mg/dL (ref 0.61–1.24)
Chloride: 105 mmol/L (ref 101–111)
GFR calc Af Amer: >60 mL/min (ref >60)
GLUCOSE: 99 mg/dL (ref 65–99)
Potassium: 3.6 mmol/L (ref 3.5–5.1)
SODIUM: 137 mmol/L (ref 135–145)

## 2017-02-09 MED ORDER — ISOSORBIDE MONONITRATE ER 30 MG PO TB24: 30.0000 mg | ORAL_TABLET | Freq: Every day | ORAL | 6 refills | Status: DC

## 2017-02-09 MED ORDER — CARVEDILOL 12.5 MG PO TABS: 12.5000 mg | ORAL_TABLET | Freq: Two times a day (BID) | ORAL | 11 refills | Status: DC

## 2017-02-09 MED ORDER — ASPIRIN 81 MG PO CHEW: 81.0000 mg | CHEWABLE_TABLET | Freq: Every day | ORAL | 11 refills | Status: DC

## 2017-02-09 MED ORDER — TICAGRELOR 90 MG PO TABS: 90.0000 mg | ORAL_TABLET | Freq: Two times a day (BID) | ORAL | 11 refills | Status: DC

## 2017-02-09 MED ORDER — SPIRONOLACTONE 25 MG PO TABS: 12.5000 mg | ORAL_TABLET | Freq: Two times a day (BID) | ORAL | 6 refills | Status: DC

## 2017-02-09 NOTE — Progress Notes (Signed)
For possible discharge today, awaiting cardiology to evaluate.  Review[facility] with patient and daughter.  Also reviewed NTG usage, angina symptoms, when to call 911, and how to progress activity and exercise at home.  [id number]

## 2017-02-09 NOTE — Discharge Summary (Signed)
Discharge Summary    Patient ID: Joseph Guerra,  MRN: [id number], DOB/AGE: 1946-02-25 71 y.o.  Admit date: 02/06/2017 Discharge date: 02/09/2017  Primary Care Provider: Andreas Blower. Primary Cardiologist: Dr. Bing Matter   Discharge Diagnoses    Active Problems:   ST elevation myocardial infarction (STEMI) (HCC)   ST elevation myocardial infarction (STEMI) of anterolateral wall (HCC)   Hyperlipidemia LDL goal <70   Ischemic cardiomyopathy   Hypokalemia   Allergies No Known Allergies  Diagnostic Studies/Procedures    Conclusion     Prox Cx lesion, 50 %stenosed.  Dist LAD lesion, 100 %stenosed.  Post intervention, there is a 10% residual stenosis.  Mid LAD lesion, 40 %stenosed.  Prox RCA to Dist RCA lesion, 100 %stenosed.  The left ventricular systolic function is normal.  LV end diastolic pressure is normal.  There is trivial (1+) mitral regurgitation.  1. Severe two-vessel coronary artery disease with patent stent in the mid LAD with moderate in-stent restenosis. The RCA is a full metal jacket and chronically occluded with left-to-right collaterals. The culprit for STEMI is occluded distal LAD close to the apex.  2. Moderately to severely reduced LV systolic function with an EF of 30% with global hypokinesis and apical akinesis. Normal left ventricular end-diastolic pressure.  3. Successful balloon angioplasty of the distal LAD. The diameter is too small and too distal to place a stent.  Recommendations: Dual antiplatelet therapy for at least one year. Aggressive treatment of coronary artery disease and ischemic cardiomyopathy. I switched metoprolol to carvedilol. Uptitrate heart failure medications as tolerated and consider adding eplerenone before hospital discharge.      Echo 03/09/17 Study Conclusions  - Left ventricle: Distal septal and apical akinesis. The cavity size was moderately dilated. Wall thickness was increased in  a pattern of mild LVH. The estimated ejection fraction was 40%. Doppler parameters are consistent with both elevated ventricular end-diastolic filling pressure and elevated left atrial filling pressure. - Left atrium: The atrium was mildly dilated. - Atrial septum: No defect or patent foramen ovale was identified.     History of Present Illness     71 y.o.malewith known CAD and prior stenting to RCA and proximal LAD transferred [facility] with anterolateral STEMI.  He had been in his usual state of health until this morning when he developed acute chest discomfort while exercising. He denies any associated dyspnea, nausea, vomiting, or diaphoresis. He took 1 SL NTG with minimal improvement in his symptoms. He took an additional SL NTG around 10:00 and his symptoms persisted, therefore he went to [facility] for further evaluation.   CXR showed mild hyperinflation with no acute abnormalities. CBC with WBC of 6.0, Hgb 14.6, platelets 198. K+ 3.6. Creatinine 0.78. Initial troponin 0.06.  EKG with ST elevation along V4-V5, therefore CODE STEMI was activated and he was transferred to [facility] for an emergent cardiac catheterization. Given a Heparin bolus and started on Heparin drip prior to transfer. Upon arrival to [facility], he is without chest pain.    Hospital Course     Consultants: None  1. Anterior STEMI - Cath showed distal occlusion of LAD s/p PTCA only. The diameter is too small and too distal to place a stent. No chest pain or dyspnea. Laying comfortably in bed. However EKG showed evolving STEMI. No further intervention given lack of symptoms.   2. Multivessel CAD with prior RCA and LAD stent - detailed cath as above. Continue Imdur,  BB, ACE, statin, ASA and Brillinta.   3. ICM/acute systolic CHF - EF of 30% by cath. Continue current therapy. If LV remains low in future possible transition from ACE-I to ARNI with entresto.  4.  HLD - 02/07/2017: Cholesterol 137; HDL 46; LDL Cholesterol 76; Triglycerides 74; VLDL 15  - continue statin  The patient has been seen by Dr.Berry  today and deemed ready for discharge home. All follow-up appointments have been scheduled. Discharge medications are listed below.  _____________   Discharge Vitals Blood pressure (!) 156/70, pulse (!) 58, temperature 98.2 F (36.8 C), temperature source Oral, resp. rate 18, height 5\' 9"  (1.753 m), weight 192 lb 7.4 oz (87.3 kg), SpO2 95 %.  Filed Weights   02/06/17 1056 02/07/17 0600  Weight: 190 lb (86.2 kg) 192 lb 7.4 oz (87.3 kg)    Labs & Radiologic Studies     CBC  Recent Labs  02/07/17 0236  WBC 8.5  HGB 12.7*  HCT 38.8*  MCV 92.6  PLT 174   Basic Metabolic Panel  Recent Labs  02/08/17 0306 02/09/17 0334  NA 137 137  K 3.9 3.6  CL 106 105  CO2 26 25  GLUCOSE 105* 99  BUN 12 14  CREATININE 0.74 0.76  CALCIUM 8.9 8.7*   Liver Function Tests No results for input(s): AST, ALT, ALKPHOS, BILITOT, PROT, ALBUMIN in the last 72 hours. No results for input(s): LIPASE, AMYLASE in the last 72 hours. Cardiac Enzymes  Recent Labs  02/08/17 0751 02/08/17 1243 02/08/17 1825  TROPONINI 9.19* 5.86* 6.61*   BNP Invalid input(s): POCBNP D-Dimer No results for input(s): DDIMER in the last 72 hours. Hemoglobin A1C No results for input(s): HGBA1C in the last 72 hours. Fasting Lipid Panel  Recent Labs  02/07/17 0236  CHOL 137  HDL 46  LDLCALC 76  TRIG 74  CHOLHDL 3.0   Thyroid Function Tests No results for input(s): TSH, T4TOTAL, T3FREE, THYROIDAB in the last 72 hours.  Invalid input(s): FREET3  Dg Chest 2 View  Result Date: 02/06/2017 CLINICAL DATA:  Onset of chest tightness this morning. Former smoker. History of previous MI. EXAM: CHEST  2 VIEW COMPARISON:  None in PACs FINDINGS: The lungs are well-expanded with mild hemidiaphragm flattening. The heart and pulmonary vascularity are normal. The mediastinum  is normal in width. There is no pleural effusion or pneumothorax. There is multilevel degenerative disc disease of the thoracic spine. IMPRESSION: Mild hyperinflation may be voluntary or may reflect the patient's smoking history. No pneumonia, pulmonary edema, nor other acute cardiopulmonary abnormality. Thoracic spine osteoarthritis. Electronically Signed   By: David  [place] M.D.   On: 02/06/2017 11:12    Disposition   Pt is being discharged home today in good condition.  Follow-up Plans & Appointments    Follow-up Information    Dr. Bing Matter Follow up.        Georgeanna Lea., MD Follow up.   Specialty:  Cardiology Why:  office will call with time and date. IF yod did not heard by Tuesday, please give as a call Contact information: [address] [place] [address] [phone]          Discharge Instructions    Amb Referral to Cardiac Rehabilitation    Complete by:  As directed    Diagnosis:   PTCA Comment - to [place] STEMI     Diet - low sodium heart healthy    Complete by:  As directed  Discharge instructions    Complete by:  As directed    No driving for 2 weeks. No lifting over 10 lbs for 4 weeks. No sexual activity for 4 weeks. You may not return to work until cleared by your cardiologist. Keep procedure site clean & dry. If you notice increased pain, swelling, bleeding or pus, call/return!  You may shower, but no soaking baths/hot tubs/pools for 1 week.   Increase activity slowly    Complete by:  As directed       Discharge Medications   Current Discharge Medication List    START taking these medications   Details  aspirin 81 MG chewable tablet Chew 1 tablet (81 mg total) by mouth daily. Qty: 30 tablet, Refills: 11    carvedilol (COREG) 12.5 MG tablet Take 1 tablet (12.5 mg total) by mouth 2 (two) times daily with a meal. Qty: 60 tablet, Refills: 11    isosorbide mononitrate (IMDUR) 30 MG 24 hr tablet Take 1 tablet (30 mg total)  by mouth daily. Qty: 30 tablet, Refills: 6    spironolactone (ALDACTONE) 25 MG tablet Take 0.5 tablets (12.5 mg total) by mouth 2 (two) times daily. Qty: 30 tablet, Refills: 6    ticagrelor (BRILINTA) 90 MG TABS tablet Take 1 tablet (90 mg total) by mouth 2 (two) times daily. Qty: 180 tablet, Refills: 11      CONTINUE these medications which have NOT CHANGED   Details  atorvastatin (LIPITOR) 80 MG tablet Take 80 mg by mouth daily.    folic acid (FOLVITE) 400 MCG tablet Take 400 mcg by mouth daily.    nitroGLYCERIN (NITROSTAT) 0.3 MG SL tablet Place 0.3 mg under the tongue every 5 (five) minutes as needed for chest pain.    ramipril (ALTACE) 5 MG capsule Take 5 mg by mouth daily.      STOP taking these medications     metoprolol succinate (TOPROL-XL) 25 MG 24 hr tablet          Aspirin prescribed at discharge?  Yes High Intensity Statin Prescribed? (Lipitor 40-80mg  or Crestor 20-40mg ): Yes Beta Blocker Prescribed? Yes For EF 45% or less, Was ACEI/ARB Prescribed? Yes ADP Receptor Inhibitor Prescribed? (i.e. Plavix etc.-Includes Medically Managed Patients): Yes For EF <40%, Aldosterone Inhibitor Prescribed? Yes Was EF assessed during THIS hospitalization? Yes Was Cardiac Rehab II ordered? (Included Medically managed Patients): Yes   Outstanding Labs/Studies   None  Duration of Discharge Encounter   Greater than 30 minutes including physician time.  Signed, Lateshia Schmoker PA-C 02/09/2017, 11:41 AM

## 2017-02-09 NOTE — Progress Notes (Signed)
Progress Note  Patient Name: Joseph Guerra Date of Encounter: 02/09/2017  Primary Cardiologist: Dr. Bing MatterKrasowski   Day 2 s/p anterolateral STEMI  Subjective   No chest pain or dyspnea. Laying comfortably in bed. However EKG showed evolving STEMI. Postop day #3 distal LAD angioplasty in setting of acute anterior wall myocardial infarction with known prior CAD.  Inpatient Medications    Scheduled Meds: . aspirin  81 mg Oral Daily  . atorvastatin  80 mg Oral q1800  . carvedilol  9.375 mg Oral BID WC  . enoxaparin (LOVENOX) injection  40 mg Subcutaneous Q24H  . isosorbide mononitrate  30 mg Oral Daily  . ramipril  5 mg Oral Daily  . sodium chloride flush  3 mL Intravenous Q12H  . spironolactone  12.5 mg Oral BID  . ticagrelor  90 mg Oral BID   Continuous Infusions: . sodium chloride     PRN Meds: sodium chloride, acetaminophen, ondansetron (ZOFRAN) IV, sodium chloride flush   Vital Signs    Vitals:   02/08/17 1708 02/08/17 2154 02/09/17 0615 02/09/17 0938  BP:  130/66 (!) 155/76 (!) 156/70  Pulse: 62 (!) 50 (!) 57 (!) 58  Resp:  18 18   Temp:  97.5 F (36.4 C) 98.2 F (36.8 C)   TempSrc:  Oral Oral   SpO2:  97% 95%   Weight:      Height:        Intake/Output Summary (Last 24 hours) at 02/09/17 1134 Last data filed at 02/08/17 1645  Gross per 24 hour  Intake              480 ml  Output                0 ml  Net              480 ml   Filed Weights   02/06/17 1056 02/07/17 0600  Weight: 190 lb (86.2 kg) 192 lb 7.4 oz (87.3 kg)    Telemetry    Sinus rhythm with PACs and PVCs - Personally Reviewed  ECG    Evolving changes of acute anterolateral micropuncture- Personally Reviewed  Physical Exam   GEN: No acute distress.   Neck: No JVD Cardiac: RRR, no murmurs, rubs, or gallops.  Respiratory: Clear to auscultation bilaterally. GI: Soft, nontender, non-distended  MS: No edema; No deformity. Neuro:  Nonfocal  Psych: Normal affect   Labs      Chemistry  Recent Labs Lab 02/07/17 0236 02/08/17 0306 02/09/17 0334  NA 138 137 137  K 3.3* 3.9 3.6  CL 107 106 105  CO2 25 26 25   GLUCOSE 101* 105* 99  BUN 10 12 14   CREATININE 0.66 0.74 0.76  CALCIUM 8.4* 8.9 8.7*  GFRNONAA >60 >60 >60  GFRAA >60 >60 >60  ANIONGAP 6 5 7      Hematology  Recent Labs Lab 02/06/17 1050 02/07/17 0236  WBC 6.0 8.5  RBC 4.63 4.19*  HGB 14.6 12.7*  HCT 43.1 38.8*  MCV 93.1 92.6  MCH 31.5 30.3  MCHC 33.9 32.7  RDW 13.6 13.6  PLT 198 174    Cardiac Enzymes  Recent Labs Lab 02/06/17 1050 02/08/17 0751 02/08/17 1243 02/08/17 1825  TROPONINI 0.06* 9.19* 5.86* 6.61*   No results for input(s): TROPIPOC in the last 168 hours.   BNPNo results for input(s): BNP, PROBNP in the last 168 hours.   DDimer No results for input(s): DDIMER in the last 168 hours.  Radiology    No results found.  Cardiac Studies  Conclusion     Prox Cx lesion, 50 %stenosed.  Dist LAD lesion, 100 %stenosed.  Post intervention, there is a 10% residual stenosis.  Mid LAD lesion, 40 %stenosed.  Prox RCA to Dist RCA lesion, 100 %stenosed.  The left ventricular systolic function is normal.  LV end diastolic pressure is normal.  There is trivial (1+) mitral regurgitation.  1. Severe two-vessel coronary artery disease with patent stent in the mid LAD with moderate in-stent restenosis. The RCA is a full metal jacket and chronically occluded with left-to-right collaterals. The culprit for STEMI is occluded distal LAD close to the apex.  2. Moderately to severely reduced LV systolic function with an EF of 30% with global hypokinesis and apical akinesis. Normal left ventricular end-diastolic pressure.  3. Successful balloon angioplasty of the distal LAD. The diameter is too small and too distal to place a stent.  Recommendations: Dual antiplatelet therapy for at least one year. Aggressive treatment of coronary artery disease and ischemic  cardiomyopathy. I switched metoprolol to carvedilol. Uptitrate heart failure medications as tolerated and consider adding eplerenone before hospital discharge.      Echo 03/09/17 Study Conclusions  - Left ventricle: Distal septal and apical akinesis. The cavity   size was moderately dilated. Wall thickness was increased in a   pattern of mild LVH. The estimated ejection fraction was 40%.   Doppler parameters are consistent with both elevated ventricular   end-diastolic filling pressure and elevated left atrial filling   pressure. - Left atrium: The atrium was mildly dilated. - Atrial septum: No defect or patent foramen ovale was identified.  Patient Profile     71 y.o. male with known CAD and prior stentint to RCA and proximal LAD transferred from [facility] with anterolateral STEMI.  Assessment & Plan    1. Anterior STEMI - Cath showed distal occlusion of LAD s/p PTCA only. The diameter is too small and too distal to place a stent.   2. Multivessel CAD with prior RCA and LAD stent - detailed cath as above. Continue Imdur, BB, ACE, statin, ASA and Brillinta.   3. ICM/acute systolic CHF - EF of 30% by cath. Continue current therapy. If LV remains low in future possible transition from ACE-I to ARNI with entresto.  4. HLD - 02/07/2017: Cholesterol 137; HDL 46; LDL Cholesterol 76; Triglycerides 74; VLDL 15   - continue statin  Patient postop day 3 angioplasty of occluded distal LAD. He does have moderate LV dysfunction on appropriate medications. His EKG shows evolving changes of an anterolateral infarct. He is asymptomatic. His right radial puncture site is healing well. He is stable for discharge home today on dual antiplatelet therapy as outlined. He will follow-up with Dr. Bing Matter in [facility] office the first week of July.  Joseph Sias, MD  02/09/2017, 11:34 AM

## 2017-02-11 ENCOUNTER — Telehealth: Payer: Self-pay

## 2017-02-11 NOTE — Telephone Encounter (Signed)
New message ° ° ° °Pt is returning call to Jennifer. °

## 2017-02-11 NOTE — Telephone Encounter (Signed)
Call placed to Pt for f/u s/p hospitalization.  Call went to VM. Left message with this nurse name and # requesting call back.

## 2017-02-11 NOTE — Telephone Encounter (Signed)
-----   Message from EarlimartBhavinkumar Bhagat, [place]PA sent at 02/09/2017 11:34 AM EDT ----- Please schedule this patient for a follow-up appointment.  Primary Cardiologist: Dr. Bing MatterKrasowski  Date of Discharge: 02/09/2017 Appointment Needed Within: TCM 7-10 Appointment Type:  Will needs follow up in our new [facility]  Thank you! Chelsea AusVin Bhagat, PA-C

## 2017-02-13 NOTE — Telephone Encounter (Signed)
2nd outreach:  Call placed to Pt for f/u s/p hospitalization.  Call went to VM. Left message with this nurse name and # requesting call back.  Left appt information on VM:  Dr. Bing MatterKrasowski @ [facility] 02/25/2017 @ 8:40 am.  Will await call back.

## 2017-02-13 NOTE — Telephone Encounter (Signed)
Follow up ° ° ° °Pt daughter is returning call to RN. °

## 2017-02-13 NOTE — Telephone Encounter (Signed)
[facility]  Patient understands to follow up with provider Dr. Bing MatterKrasowski 02/25/2017 @ 8:40 am @ [facility].  Pt indicates understanding.  Patient understands discharge instructions? Pt denies any educational needs Patient understands medications and regiment? Pt states he does, states he has all his new meds Patient understands to bring all medications to this visit? yes  Pt states he is doing well.  Denies chest pain.  Denies any further educational needs.

## 2017-02-15 ENCOUNTER — Telehealth: Payer: Self-pay | Admitting: Cardiology

## 2017-02-15 NOTE — Telephone Encounter (Signed)
Closed Encounter  °

## 2017-02-25 ENCOUNTER — Ambulatory Visit: Payer: Medicare Other | Admitting: Cardiology

## 2017-02-25 ENCOUNTER — Ambulatory Visit (INDEPENDENT_AMBULATORY_CARE_PROVIDER_SITE_OTHER): Payer: Medicare Other | Admitting: Cardiology

## 2017-02-25 ENCOUNTER — Encounter: Payer: Self-pay | Admitting: Cardiology

## 2017-02-25 DIAGNOSIS — I255 Ischemic cardiomyopathy: Secondary | ICD-10-CM | POA: Diagnosis not present

## 2017-02-25 MED ORDER — NITROGLYCERIN 0.3 MG SL SUBL: 0.3000 mg | SUBLINGUAL_TABLET | SUBLINGUAL | 1 refills | Status: DC | PRN

## 2017-02-25 MED ORDER — RAMIPRIL 5 MG PO CAPS: 5.0000 mg | ORAL_CAPSULE | Freq: Every day | ORAL | 3 refills | Status: DC

## 2017-02-25 NOTE — Progress Notes (Signed)
Cardiology Office Note:    Date:  02/25/2017   ID:  Joseph Guerra, DOB 19-Oct-1945, MRN [id number]  PCP:  Andreas Blowerabeza, Yuri M., MD  Cardiologist:  Gypsy Balsamobert Ralene Gasparyan, MD    Referring MD: Andreas Blowerabeza, Yuri M., MD   Chief Complaint  Patient presents with  . Hospitalization Follow-up  [facility] because of [facility] infarction. He did have complete occlusion of distal LAD which was ballooned. 2 small for stenting. Since that time he's taking great asymptomatic, denies having any chest pain tightness squeezing pressure burning chest.  History of Present Illness:    Joseph ReaperStanislaw Ranker is a 71 y.o. male with a hx of Myocardial infarction, recent cardiac catheterization showing completely occluded right coronary artery, he also has completely occluded distal LAD which was in acute phase of myocardial infarction. It was ballooned. He's doing well now.  Past Medical History:  Diagnosis Date  . CAD (coronary artery disease)    a. prior MI in 2001 with stents placed at that time (patient unsure of which arteries)  . Hypercholesteremia   . Hypertension     Past Surgical History:  Procedure Laterality Date  . CORONARY ANGIOPLASTY WITH STENT PLACEMENT    . CORONARY BALLOON ANGIOPLASTY N/A 02/06/2017   Procedure: Coronary Balloon Angioplasty;  Surgeon: Iran OuchArida, Muhammad A, MD;  Location: [facility] INVASIVE CV LAB;  Service: Cardiovascular;  Laterality: N/A;  . LEFT HEART CATH AND CORONARY ANGIOGRAPHY N/A 02/06/2017   Procedure: Left Heart Cath and Coronary Angiography;  Surgeon: Iran OuchArida, Muhammad A, MD;  [facility];  Service: Cardiovascular;  Laterality: N/A;    Current Medications: Current Meds  Medication Sig  . aspirin 81 MG chewable tablet Chew 1 tablet (81 mg total) by mouth daily.  Marland Kitchen. atorvastatin (LIPITOR) 80 MG tablet Take 80 mg by mouth daily.  . carvedilol (COREG) 12.5 MG tablet Take 1 tablet (12.5 mg total) by mouth 2 (two) times daily with a  meal.  . folic acid (FOLVITE) 400 MCG tablet Take 400 mcg by mouth daily.  . isosorbide mononitrate (IMDUR) 30 MG 24 hr tablet Take 1 tablet (30 mg total) by mouth daily.  . nitroGLYCERIN (NITROSTAT) 0.3 MG SL tablet Place 0.3 mg under the tongue every 5 (five) minutes as needed for chest pain.  . ramipril (ALTACE) 5 MG capsule Take 5 mg by mouth daily.  Marland Kitchen. spironolactone (ALDACTONE) 25 MG tablet Take 0.5 tablets (12.5 mg total) by mouth 2 (two) times daily.  . ticagrelor (BRILINTA) 90 MG TABS tablet Take 1 tablet (90 mg total) by mouth 2 (two) times daily.     Allergies:   Patient has no known allergies.   Social History   Social History  . Marital status: Married    Spouse name: N/A  . Number of children: N/A  . Years of education: N/A   Social History Main Topics  . Smoking status: Former Games developermoker  . Smokeless tobacco: Never Used  . Alcohol use Yes     Comment: occ  . Drug use: No  . Sexual activity: Not Asked   Other Topics Concern  . None   Social History Narrative  . None     Family History: The patient's family history includes Heart disease in his father. ROS:   Please see the history of present illness.     All other systems reviewed and are negative.  EKGs/Labs/Other Studies Reviewed:    The following studies were reviewed today: [facility]  EKG:  EKG is  ordered today.  The ekg ordered today demonstrates   Recent Labs: 02/07/2017: Hemoglobin 12.7; Platelets 174 02/09/2017: BUN 14; Creatinine, Ser 0.76; Potassium 3.6; Sodium 137  Recent Lipid Panel    Component Value Date/Time   CHOL 137 02/07/2017 0236   TRIG 74 02/07/2017 0236   HDL 46 02/07/2017 0236   CHOLHDL 3.0 02/07/2017 0236   VLDL 15 02/07/2017 0236   LDLCALC 76 02/07/2017 0236    Physical Exam:    VS:  BP 100/64   Pulse 72   Resp 16   Ht 5\' 9"  (1.753 m)   Wt 187 lb 12.8 oz (85.2 kg)   BMI 27.73 kg/m     Wt Readings from Last 3 Encounters:  02/25/17  187 lb 12.8 oz (85.2 kg)  02/07/17 192 lb 7.4 oz (87.3 kg)     GEN:  Well nourished, well developed in no acute distress HEENT: Normal NECK: No JVD; No carotid bruits LYMPHATICS: No lymphadenopathy CARDIAC: RRR, no murmurs, rubs, gallops RESPIRATORY:  Clear to auscultation without rales, wheezing or rhonchi  ABDOMEN: Soft, non-tender, non-distended MUSCULOSKELETAL:  No edema; No deformity  SKIN: Warm and dry NEUROLOGIC:  Alert and oriented x 3 PSYCHIATRIC:  Normal affect   ASSESSMENT:    1. Ischemic cardiomyopathy    PLAN:    In order of problems listed above:  1. Coronary artery disease, status post balloon angioplasty of distal LAD. Doing well from that point review I did review all these medications all appropriate and will continue. 2. Ischemic cardio myopathy with ejection fraction of 30%, he's an ACE inhibitor, beta blocker, Aldactone which I will continue. I will check his chem 7 today to check electrolytes. The goal will be hopefully able to increase his dose of Altace. However, his blood pressure being low. 3. Dyslipidemia: He needs to be a high intensity statin because of his coronary artery disease. His weight on 80 mg of Lipitor which I will do. We'll check his fasting profile to see if therapy sufficient. 4. Overall feeling well cardiac-wise right now. I will ask him to have echocardiogram done in about 2 months to see if there is any improvement of left ventricular ejection fraction, if no will consider ICD.   Medication Adjustments/Labs and Tests Ordered: Current medicines are reviewed at length with the patient today.  Concerns regarding medicines are outlined above.  No orders of the defined types were placed in this encounter.  No orders of the defined types were placed in this encounter.   Signed, Gypsy Balsam, MD  02/25/2017 10:45 AM    [facility]

## 2017-02-25 NOTE — Patient Instructions (Signed)
Medication Instructions:  Your physician recommends that you continue on your current medications as directed. Please refer to the Current Medication list given to you today.  Labwork: Your physician recommends that you return for lab work in: 3 weeks and Lipids today  Testing/Procedures: none  Follow-Up: Your physician recommends that you schedule a follow-up appointment in: 1 month    Any Other Special Instructions Will Be Listed Below (If Applicable).     If you need a refill on your cardiac medications before your next appointment, please call your pharmacy.

## 2017-02-26 LAB — BASIC METABOLIC PANEL
BUN / CREAT RATIO: 18 (ref 10–24)
BUN: 17 mg/dL (ref 8–27)
CO2: 23 mmol/L (ref 20–29)
CREATININE: 0.92 mg/dL (ref 0.76–1.27)
Calcium: 9.5 mg/dL (ref 8.6–10.2)
Chloride: 101 mmol/L (ref 96–106)
GFR calc Af Amer: 98 mL/min/{1.73_m2} (ref >59)
GFR calc non Af Amer: 85 mL/min/{1.73_m2} (ref >59)
GLUCOSE: 98 mg/dL (ref 65–99)
Potassium: 5 mmol/L (ref 3.5–5.2)
Sodium: 139 mmol/L (ref 134–144)

## 2017-02-28 ENCOUNTER — Telehealth: Payer: Self-pay | Admitting: Cardiology

## 2017-02-28 NOTE — Telephone Encounter (Signed)
Please call his dauther about the dose of SPiralactone

## 2017-03-01 NOTE — Telephone Encounter (Signed)
S/w daughter ok per pt and discussed medication and labs. Also provided education regarding water intake and diet.

## 2017-03-27 LAB — LIPID PANEL
CHOL/HDL RATIO: 2.4 ratio (ref 0.0–5.0)
CHOLESTEROL TOTAL: 117 mg/dL (ref 100–199)
HDL: 49 mg/dL (ref >39)
LDL CALC: 49 mg/dL (ref 0–99)
Triglycerides: 96 mg/dL (ref 0–149)
VLDL Cholesterol Cal: 19 mg/dL (ref 5–40)

## 2017-03-28 ENCOUNTER — Ambulatory Visit (INDEPENDENT_AMBULATORY_CARE_PROVIDER_SITE_OTHER): Payer: Medicare Other | Admitting: Cardiology

## 2017-03-28 ENCOUNTER — Encounter: Payer: Self-pay | Admitting: Cardiology

## 2017-03-28 VITALS — BP 120/62 | HR 76 | Resp 10 | Ht 69.0 in | Wt 181.1 lb

## 2017-03-28 DIAGNOSIS — I255 Ischemic cardiomyopathy: Secondary | ICD-10-CM | POA: Diagnosis not present

## 2017-03-28 DIAGNOSIS — E876 Hypokalemia: Secondary | ICD-10-CM

## 2017-03-28 DIAGNOSIS — E785 Hyperlipidemia, unspecified: Secondary | ICD-10-CM

## 2017-03-28 DIAGNOSIS — I493 Ventricular premature depolarization: Secondary | ICD-10-CM | POA: Diagnosis not present

## 2017-03-28 HISTORY — DX: Ventricular premature depolarization: I49.3

## 2017-03-28 NOTE — Progress Notes (Signed)
Cardiology Office Note:    Date:  03/28/2017   ID:  Joseph Guerra, DOB 08-03-1946, MRN [id number]  PCP:  Andreas Blowerabeza, Yuri M., MD  Cardiologist:  Gypsy Balsamobert Alyssamarie Mounsey, MD    Referring MD: Andreas Blowerabeza, Yuri M., MD   Chief Complaint  Patient presents with  . 1 month follow up  Complaining of being weak and tired.  History of Present Illness:    Joseph ReaperStanislaw Dvorsky is a 71 y.o. male  doing fair he said he has good days and bad days. Yesterday he felt very poor he was very weak and exhausted and tired. Denies having any chest pain tightness squeezing pressure burning chest. He tried to exercise in the medical basis but yesterday he was too exhausted to finish. Today he was fine to have frequent ventricular ectopy on the EKG. There was also one ventricular couplet. No syncope no dizziness.  Past Medical History:  Diagnosis Date  . CAD (coronary artery disease)    a. prior MI in 2001 with stents placed at that time (patient unsure of which arteries)  . Hypercholesteremia   . Hypertension     Past Surgical History:  Procedure Laterality Date  . CORONARY ANGIOPLASTY WITH STENT PLACEMENT    . CORONARY BALLOON ANGIOPLASTY N/A 02/06/2017   Procedure: Coronary Balloon Angioplasty;  Surgeon: Iran OuchArida, Muhammad A, MD;  Location: [facility];  Service: Cardiovascular;  Laterality: N/A;  . LEFT HEART CATH AND CORONARY ANGIOGRAPHY N/A 02/06/2017   Procedure: Left Heart Cath and Coronary Angiography;  Surgeon: Iran OuchArida, Muhammad A, MD;  [facility];  Service: Cardiovascular;  Laterality: N/A;    Current Medications: Current Meds  Medication Sig  . aspirin 81 MG chewable tablet Chew 1 tablet (81 mg total) by mouth daily.  Marland Kitchen. atorvastatin (LIPITOR) 80 MG tablet Take 80 mg by mouth daily.  . carvedilol (COREG) 12.5 MG tablet Take 1 tablet (12.5 mg total) by mouth 2 (two) times daily with a meal.  . folic acid (FOLVITE) 400 MCG tablet Take 400 mcg by mouth daily.  . isosorbide mononitrate  (IMDUR) 30 MG 24 hr tablet Take 1 tablet (30 mg total) by mouth daily.  . nitroGLYCERIN (NITROSTAT) 0.3 MG SL tablet Place 1 tablet (0.3 mg total) under the tongue every 5 (five) minutes as needed for chest pain.  . ramipril (ALTACE) 5 MG capsule Take 1 capsule (5 mg total) by mouth daily.  Marland Kitchen. spironolactone (ALDACTONE) 25 MG tablet Take 0.5 tablets (12.5 mg total) by mouth 2 (two) times daily.  . ticagrelor (BRILINTA) 90 MG TABS tablet Take 1 tablet (90 mg total) by mouth 2 (two) times daily.     Allergies:   Patient has no known allergies.   Social History   Social History  . Marital status: Married    Spouse name: N/A  . Number of children: N/A  . Years of education: N/A   Social History Main Topics  . Smoking status: Former Games developermoker  . Smokeless tobacco: Never Used  . Alcohol use Yes     Comment: occ  . Drug use: No  . Sexual activity: Not Asked   Other Topics Concern  . None   Social History Narrative  . None     Family History: The patient's family history includes Heart disease in his father. ROS:   Please see the history of present illness.    All 14 point review of systems negative except as described per history of present illness  EKGs/Labs/Other Studies Reviewed:  Recent Labs: 02/07/2017: Hemoglobin 12.7; Platelets 174 02/25/2017: BUN 17; Creatinine, Ser 0.92; Potassium 5.0; Sodium 139  Recent Lipid Panel    Component Value Date/Time   CHOL 117 03/26/2017 0913   TRIG 96 03/26/2017 0913   HDL 49 03/26/2017 0913   CHOLHDL 2.4 03/26/2017 0913   CHOLHDL 3.0 02/07/2017 0236   VLDL 15 02/07/2017 0236   LDLCALC 49 03/26/2017 0913    Physical Exam:    VS:  BP 120/62   Pulse 76   Resp 10   Ht 5\' 9"  (1.753 m)   Wt 181 lb 1.9 oz (82.2 kg)   BMI 26.75 kg/m     Wt Readings from Last 3 Encounters:  03/28/17 181 lb 1.9 oz (82.2 kg)  02/25/17 187 lb 12.8 oz (85.2 kg)  02/07/17 192 lb 7.4 oz (87.3 kg)     GEN:  Well nourished, well developed in no  acute distress HEENT: Normal NECK: No JVD; No carotid bruits LYMPHATICS: No lymphadenopathy CARDIAC: RRR, no murmurs, no rubs, no gallops RESPIRATORY:  Clear to auscultation without rales, wheezing or rhonchi  ABDOMEN: Soft, non-tender, non-distended MUSCULOSKELETAL:  No edema; No deformity  SKIN: Warm and dry LOWER EXTREMITIES: no swelling NEUROLOGIC:  Alert and oriented x 3 PSYCHIATRIC:  Normal affect   ASSESSMENT:    1. Ischemic cardiomyopathy   2. Hypokalemia   3. Hyperlipidemia LDL goal <70   4. PVC's (premature ventricular contractions)    PLAN:    In order of problems listed above:  Ischemic coronary myopathy: On ACE inhibitor, beta blocker,. Complaining of being weak and tired and exhausted which could be related to medication that he takes. I will keep monitoring his blood pressure. I will check his Chem-7 as well as magnesium today. I will take a quick look with echocardiogram to see if there is any improvement left ventricular ejection fraction. History of hypokalemia: Last potassium was normal but will check today. Dyslipidemia: On high intensity statin which I will continue. EKG today showed PVCs were well ventricle couplets. He complaining of having weakness and fatigue. I'm concerned about frequent ventricular ectopy and he is diminished ejection fraction. In terms of trying to determine the course of action for situation like this I will ask him to have echo done quickly today just to assess left ventricle ejection fraction. I will also asked to have a Holter monitor to see the amount of PVCs. Quick look with echocardiogram was done his ejection fraction is probably best described as in the neighborhood of 35%  Medication Adjustments/Labs and Tests Ordered: Current medicines are reviewed at length with the patient today.  Concerns regarding medicines are outlined above.  Orders Placed This Encounter  Procedures  . Basic metabolic panel  . Magnesium  . Holter  monitor - 48 hour  . EKG 12-Lead   Medication changes: No orders of the defined types were placed in this encounter.   Signed, Georgeanna Leaobert J. Gloris Shiroma, MD, [place] 03/28/2017 10:19 AM    [facility]

## 2017-03-28 NOTE — Patient Instructions (Addendum)
Medication Instructions:  Your physician recommends that you continue on your current medications as directed. Please refer to the Current Medication list given to you today.  Labwork: Your physician recommends that you have labs today. A BMP, and Magnesium level  Testing/Procedures: EKG today in office.  Your physician has recommended that you wear a holter monitor. Holter monitors are medical devices that record the heart's electrical activity. Doctors most often use these monitors to diagnose arrhythmias. Arrhythmias are problems with the speed or rhythm of the heartbeat. The monitor is a small, portable device. You can wear one while you do your normal daily activities. This is usually used to diagnose what is causing palpitations/syncope (passing out).    Follow-Up: Your physician recommends that you schedule a follow-up appointment in: 1 month  Any Other Special Instructions Will Be Listed Below (If Applicable).     If you need a refill on your cardiac medications before your next appointment, please call your pharmacy.

## 2017-04-01 ENCOUNTER — Ambulatory Visit (INDEPENDENT_AMBULATORY_CARE_PROVIDER_SITE_OTHER): Payer: Medicare Other

## 2017-04-01 ENCOUNTER — Other Ambulatory Visit: Payer: Self-pay | Admitting: Cardiology

## 2017-04-01 DIAGNOSIS — I493 Ventricular premature depolarization: Secondary | ICD-10-CM | POA: Diagnosis not present

## 2017-04-01 DIAGNOSIS — I255 Ischemic cardiomyopathy: Secondary | ICD-10-CM

## 2017-04-01 DIAGNOSIS — R531 Weakness: Secondary | ICD-10-CM | POA: Diagnosis not present

## 2017-04-01 DIAGNOSIS — R5383 Other fatigue: Secondary | ICD-10-CM | POA: Diagnosis not present

## 2017-05-02 ENCOUNTER — Ambulatory Visit (INDEPENDENT_AMBULATORY_CARE_PROVIDER_SITE_OTHER): Payer: Medicare Other | Admitting: Cardiology

## 2017-05-02 ENCOUNTER — Encounter: Payer: Self-pay | Admitting: Cardiology

## 2017-05-02 VITALS — BP 102/62 | HR 56 | Resp 10 | Ht 69.0 in | Wt 184.0 lb

## 2017-05-02 DIAGNOSIS — E876 Hypokalemia: Secondary | ICD-10-CM

## 2017-05-02 DIAGNOSIS — E785 Hyperlipidemia, unspecified: Secondary | ICD-10-CM

## 2017-05-02 DIAGNOSIS — I493 Ventricular premature depolarization: Secondary | ICD-10-CM

## 2017-05-02 DIAGNOSIS — I255 Ischemic cardiomyopathy: Secondary | ICD-10-CM

## 2017-05-02 NOTE — Patient Instructions (Signed)
Medication Instructions:  Your physician recommends that you continue on your current medications as directed. Please refer to the Current Medication list given to you today. Labwork: We would like to check your magnesium level today as well as your sodium and potasium level and also your kidney function.  Testing/Procedures: Your physician has requested that you have an echocardiogram. Echocardiography is a painless test that uses sound waves to create images of your heart. It provides your doctor with information about the size and shape of your heart and how well your heart's chambers and valves are working. This procedure takes approximately one hour. There are no restrictions for this procedure.  Follow-Up: Your physician recommends that you schedule a follow-up appointment in: 3 months   Any Other Special Instructions Will Be Listed Below (If Applicable).  Please note that any paperwork needing to be filled out by the provider will need to be addressed at the front desk prior to seeing the provider. Please note that any paperwork FMLA, Disability or other documents regarding health condition is subject to a $25.00 charge that must be received prior to completion of paperwork in the form of a money order or check.     If you need a refill on your cardiac medications before your next appointment, please call your pharmacy.

## 2017-05-02 NOTE — Progress Notes (Signed)
Cardiology Office Note:    Date:  05/02/2017   ID:  Earleen Reaper, DOB 06-08-1946, MRN [id number]  PCP:  Andreas Blower., MD  Cardiologist:  Gypsy Balsam, MD    Referring MD: Andreas Blower., MD   Chief Complaint  Patient presents with  . 1 month follow up  He is doing well  History of Present Illness:    Joseph Guerra is a 71 y.o. male  with coronary artery disease, recent myocardial infarction. He is doing much better at this time. Last time I spoke to him he was complaining of being weak tried exhausted having no energy however now he is feeling significantly better he said he started drinking more fluids and he thinks that is what helps him no dizziness no passing out no chest pain tightness squeezing pressure burning chest. Goes to rehabilitation on the radial basis and enjoys tremendously.  Past Medical History:  Diagnosis Date  . CAD (coronary artery disease)    a. prior MI in 2001 with stents placed at that time (patient unsure of which arteries)  . Hypercholesteremia   . Hypertension     Past Surgical History:  Procedure Laterality Date  . CORONARY ANGIOPLASTY WITH STENT PLACEMENT    . CORONARY BALLOON ANGIOPLASTY N/A 02/06/2017   Procedure: Coronary Balloon Angioplasty;  Surgeon: Iran Ouch, MD;  Location: [facility];  Service: Cardiovascular;  Laterality: N/A;  . LEFT HEART CATH AND CORONARY ANGIOGRAPHY N/A 02/06/2017   Procedure: Left Heart Cath and Coronary Angiography;  Surgeon: Iran Ouch, MD;  Location: [facility] INVASIVE CV LAB;  Service: Cardiovascular;  Laterality: N/A;    Current Medications: Current Meds  Medication Sig  . aspirin 81 MG chewable tablet Chew 1 tablet (81 mg total) by mouth daily.  Marland Kitchen atorvastatin (LIPITOR) 80 MG tablet Take 80 mg by mouth daily.  . carvedilol (COREG) 12.5 MG tablet Take 1 tablet (12.5 mg total) by mouth 2 (two) times daily with a meal.  . folic acid (FOLVITE) 400 MCG tablet Take 400 mcg by mouth  daily.  . isosorbide mononitrate (IMDUR) 30 MG 24 hr tablet Take 1 tablet (30 mg total) by mouth daily.  . nitroGLYCERIN (NITROSTAT) 0.3 MG SL tablet Place 1 tablet (0.3 mg total) under the tongue every 5 (five) minutes as needed for chest pain.  . ramipril (ALTACE) 5 MG capsule Take 1 capsule (5 mg total) by mouth daily.  Marland Kitchen spironolactone (ALDACTONE) 25 MG tablet Take 0.5 tablets (12.5 mg total) by mouth 2 (two) times daily.  . ticagrelor (BRILINTA) 90 MG TABS tablet Take 1 tablet (90 mg total) by mouth 2 (two) times daily.     Allergies:   Patient has no known allergies.   Social History   Social History  . Marital status: Married    Spouse name: N/A  . Number of children: N/A  . Years of education: N/A   Social History Main Topics  . Smoking status: Former Games developer  . Smokeless tobacco: Never Used  . Alcohol use Yes     Comment: occ  . Drug use: No  . Sexual activity: Not Asked   Other Topics Concern  . None   Social History Narrative  . None     Family History: The patient's family history includes Heart disease in his father. ROS:   Please see the history of present illness.    All 14 point review of systems negative except as described per history of present illness  EKGs/Labs/Other Studies Reviewed:      Recent Labs: 02/07/2017: Hemoglobin 12.7; Platelets 174 02/25/2017: BUN 17; Creatinine, Ser 0.92; Potassium 5.0; Sodium 139  Recent Lipid Panel    Component Value Date/Time   CHOL 117 03/26/2017 0913   TRIG 96 03/26/2017 0913   HDL 49 03/26/2017 0913   CHOLHDL 2.4 03/26/2017 0913   CHOLHDL 3.0 02/07/2017 0236   VLDL 15 02/07/2017 0236   LDLCALC 49 03/26/2017 0913    Physical Exam:    VS:  BP 102/62   Pulse (!) 56   Resp 10   Ht 5\' 9"  (1.753 m)   Wt 184 lb (83.5 kg)   BMI 27.17 kg/m     Wt Readings from Last 3 Encounters:  05/02/17 184 lb (83.5 kg)  03/28/17 181 lb 1.9 oz (82.2 kg)  02/25/17 187 lb 12.8 oz (85.2 kg)     GEN:  Well nourished,  well developed in no acute distress HEENT: Normal NECK: No JVD; No carotid bruits LYMPHATICS: No lymphadenopathy CARDIAC: RRR, no murmurs, no rubs, no gallops RESPIRATORY:  Clear to auscultation without rales, wheezing or rhonchi  ABDOMEN: Soft, non-tender, non-distended MUSCULOSKELETAL:  No edema; No deformity  SKIN: Warm and dry LOWER EXTREMITIES: no swelling NEUROLOGIC:  Alert and oriented x 3 PSYCHIATRIC:  Normal affect   ASSESSMENT:    1. Hypokalemia   2. Ischemic cardiomyopathy   3. PVC's (premature ventricular contractions)   4. Hyperlipidemia LDL goal <70    PLAN:    In order of problems listed above:  1. Hypokalemia: I will ask him to have Chem-7 done today. 2. Ischemic cardiomyopathy on appropriate medications sadly his blood pressure slowly therefore I will not be able to increase medications obviously we'll continue monitoring. 3. PVCs: Denies having dizziness or syncope I will repeat his echocardiogram next month. Last time he was here to quick look his ejection fraction looks significantly improved. 4. Dyslipidemia: Continue high intensity statin's.   Medication Adjustments/Labs and Tests Ordered: Current medicines are reviewed at length with the patient today.  Concerns regarding medicines are outlined above.  Orders Placed This Encounter  Procedures  . Basic metabolic panel  . Magnesium  . ECHOCARDIOGRAM COMPLETE   Medication changes: No orders of the defined types were placed in this encounter.   Signed, Georgeanna Leaobert J. Rossetta Kama, MD, [facility] Hospital[facility]ACC 05/02/2017 12:02 PM    [facility]

## 2017-05-03 LAB — BASIC METABOLIC PANEL
BUN / CREAT RATIO: 17 (ref 10–24)
BUN: 17 mg/dL (ref 8–27)
CALCIUM: 9.7 mg/dL (ref 8.6–10.2)
CHLORIDE: 102 mmol/L (ref 96–106)
CO2: 26 mmol/L (ref 20–29)
Creatinine, Ser: 0.99 mg/dL (ref 0.76–1.27)
GFR calc non Af Amer: 78 mL/min/{1.73_m2} (ref >59)
GFR, EST AFRICAN AMERICAN: 90 mL/min/{1.73_m2} (ref >59)
Glucose: 109 mg/dL — ABNORMAL HIGH (ref 65–99)
POTASSIUM: 4.4 mmol/L (ref 3.5–5.2)
SODIUM: 140 mmol/L (ref 134–144)

## 2017-05-03 LAB — MAGNESIUM: Magnesium: 2.3 mg/dL (ref 1.6–2.3)

## 2017-05-27 ENCOUNTER — Ambulatory Visit (HOSPITAL_BASED_OUTPATIENT_CLINIC_OR_DEPARTMENT_OTHER)
Admission: RE | Admit: 2017-05-27 | Discharge: 2017-05-27 | Disposition: A | Discharge status: Home / Self Care | Payer: Medicare Other | Source: Ambulatory Visit | Attending: Cardiology | Admitting: Cardiology

## 2017-05-27 DIAGNOSIS — E7800 Pure hypercholesterolemia, unspecified: Secondary | ICD-10-CM | POA: Insufficient documentation

## 2017-05-27 DIAGNOSIS — I255 Ischemic cardiomyopathy: Secondary | ICD-10-CM | POA: Insufficient documentation

## 2017-05-27 DIAGNOSIS — I10 Essential (primary) hypertension: Secondary | ICD-10-CM | POA: Diagnosis not present

## 2017-05-27 DIAGNOSIS — I2510 Atherosclerotic heart disease of native coronary artery without angina pectoris: Secondary | ICD-10-CM | POA: Insufficient documentation

## 2017-05-27 DIAGNOSIS — E876 Hypokalemia: Secondary | ICD-10-CM | POA: Diagnosis not present

## 2017-05-27 NOTE — Progress Notes (Signed)
  Echocardiogram 2D Echocardiogram has been performed.  Joseph Guerra 05/27/2017, 9:07 AM

## 2017-06-05 ENCOUNTER — Other Ambulatory Visit: Payer: Self-pay

## 2017-08-01 ENCOUNTER — Ambulatory Visit: Payer: Medicare Other | Admitting: Cardiology

## 2017-08-01 ENCOUNTER — Encounter: Payer: Self-pay | Admitting: Cardiology

## 2017-08-01 VITALS — BP 98/56 | HR 60 | Resp 10 | Ht 69.0 in | Wt 184.8 lb

## 2017-08-01 DIAGNOSIS — I2510 Atherosclerotic heart disease of native coronary artery without angina pectoris: Secondary | ICD-10-CM

## 2017-08-01 DIAGNOSIS — E785 Hyperlipidemia, unspecified: Secondary | ICD-10-CM | POA: Diagnosis not present

## 2017-08-01 DIAGNOSIS — I255 Ischemic cardiomyopathy: Secondary | ICD-10-CM

## 2017-08-01 MED ORDER — ISOSORBIDE MONONITRATE ER 30 MG PO TB24: 30.0000 mg | ORAL_TABLET | Freq: Every day | ORAL | 6 refills | Status: DC

## 2017-08-01 MED ORDER — SPIRONOLACTONE 25 MG PO TABS: 12.5000 mg | ORAL_TABLET | Freq: Two times a day (BID) | ORAL | 6 refills | Status: DC

## 2017-08-01 NOTE — Patient Instructions (Addendum)
Medication Instructions:  Your physician recommends that you continue on your current medications as directed. Please refer to the Current Medication list given to you today.  Labwork: None ordered  Testing/Procedures: None ordered  Follow-Up: Your physician recommends that you schedule a follow-up appointment in: 3 months with Dr. Krasowski   Any Other Special Instructions Will Be Listed Below (If Applicable).     If you need a refill on your cardiac medications before your next appointment, please call your pharmacy.   

## 2017-08-01 NOTE — Progress Notes (Signed)
Cardiology Office Note:    Date:  08/01/2017   ID:  Joseph Guerra, DOB February 26, 1946, MRN [id number]  PCP:  Andreas Blowerabeza, Yuri M., MD  Cardiologist:  Gypsy Balsamobert Ladashia Demarinis, MD    Referring MD: Andreas Blowerabeza, Yuri M., MD   Chief Complaint  Patient presents with  . Follow-up  Doing very well  History of Present Illness:    Joseph Guerra is a 71 y.o. male with coronary artery disease status post myocardial infarction.  Initially had significantly diminished left ventricular ejection fraction however with proper medical therapy ejection fraction improved to 40%.  He is doing well and denies having any chest pain tightness squeezing pressure burning chest.  No shortness of breath.  He goes to gym on the regular basis I have no difficulty doing it.  No dizziness no syncope.  Past Medical History:  Diagnosis Date  . CAD (coronary artery disease)    a. prior MI in 2001 with stents placed at that time (patient unsure of which arteries)  . Hypercholesteremia   . Hypertension     Past Surgical History:  Procedure Laterality Date  . CORONARY ANGIOPLASTY WITH STENT PLACEMENT    . CORONARY BALLOON ANGIOPLASTY N/A 02/06/2017   Procedure: Coronary Balloon Angioplasty;  Surgeon: Iran OuchArida, Muhammad A, MD;  Location: [facility];  Service: Cardiovascular;  Laterality: N/A;  . LEFT HEART CATH AND CORONARY ANGIOGRAPHY N/A 02/06/2017   Procedure: Left Heart Cath and Coronary Angiography;  Surgeon: Iran OuchArida, Muhammad A, MD;  [facility];  Service: Cardiovascular;  Laterality: N/A;    Current Medications: Current Meds  Medication Sig  . aspirin 81 MG chewable tablet Chew 1 tablet (81 mg total) by mouth daily.  Marland Kitchen. atorvastatin (LIPITOR) 80 MG tablet Take 80 mg by mouth daily.  . carvedilol (COREG) 12.5 MG tablet Take 1 tablet (12.5 mg total) by mouth 2 (two) times daily with a meal.  . folic acid (FOLVITE) 400 MCG tablet Take 400 mcg by mouth daily.  . isosorbide mononitrate (IMDUR) 30 MG 24 hr  tablet Take 1 tablet (30 mg total) by mouth daily.  . nitroGLYCERIN (NITROSTAT) 0.3 MG SL tablet Place 1 tablet (0.3 mg total) under the tongue every 5 (five) minutes as needed for chest pain.  . ramipril (ALTACE) 5 MG capsule Take 1 capsule (5 mg total) by mouth daily.  Marland Kitchen. spironolactone (ALDACTONE) 25 MG tablet Take 0.5 tablets (12.5 mg total) by mouth 2 (two) times daily.  . ticagrelor (BRILINTA) 90 MG TABS tablet Take 1 tablet (90 mg total) by mouth 2 (two) times daily.     Allergies:   Patient has no known allergies.   Social History   Socioeconomic History  . Marital status: Married    Spouse name: None  . Number of children: None  . Years of education: None  . Highest education level: None  Social Needs  . Financial resource strain: None  . Food insecurity - worry: None  . Food insecurity - inability: None  . Transportation needs - medical: None  . Transportation needs - non-medical: None  Occupational History  . None  Tobacco Use  . Smoking status: Former Games developermoker  . Smokeless tobacco: Never Used  Substance and Sexual Activity  . Alcohol use: Yes    Comment: occ  . Drug use: No  . Sexual activity: None  Other Topics Concern  . None  Social History Narrative  . None     Family History: The patient's family history includes Heart  disease in his father. ROS:   Please see the history of present illness.    All 14 point review of systems negative except as described per history of present illness  EKGs/Labs/Other Studies Reviewed:      Recent Labs: 02/07/2017: Hemoglobin 12.7; Platelets 174 05/02/2017: BUN 17; Creatinine, Ser 0.99; Magnesium 2.3; Potassium 4.4; Sodium 140  Recent Lipid Panel    Component Value Date/Time   CHOL 117 03/26/2017 0913   TRIG 96 03/26/2017 0913   HDL 49 03/26/2017 0913   CHOLHDL 2.4 03/26/2017 0913   CHOLHDL 3.0 02/07/2017 0236   VLDL 15 02/07/2017 0236   LDLCALC 49 03/26/2017 0913    Physical Exam:    VS:  BP (!) 98/56    Pulse 60   Resp 10   Ht 5\' 9"  (1.753 m)   Wt 184 lb 12.8 oz (83.8 kg)   BMI 27.29 kg/m     Wt Readings from Last 3 Encounters:  08/01/17 184 lb 12.8 oz (83.8 kg)  05/02/17 184 lb (83.5 kg)  03/28/17 181 lb 1.9 oz (82.2 kg)     GEN:  Well nourished, well developed in no acute distress HEENT: Normal NECK: No JVD; No carotid bruits LYMPHATICS: No lymphadenopathy CARDIAC: RRR, no murmurs, no rubs, no gallops RESPIRATORY:  Clear to auscultation without rales, wheezing or rhonchi  ABDOMEN: Soft, non-tender, non-distended MUSCULOSKELETAL:  No edema; No deformity  SKIN: Warm and dry LOWER EXTREMITIES: no swelling NEUROLOGIC:  Alert and oriented x 3 PSYCHIATRIC:  Normal affect   ASSESSMENT:    1. Ischemic cardiomyopathy   2. Hyperlipidemia LDL goal <70   3. Coronary artery disease involving native coronary artery of native heart without angina pectoris    PLAN:    In order of problems listed above:  1. Ischemic cardiomyopathy with ejection fraction able to 40%.  He is on ACE inhibitor as well as beta-blocker which I will continue.  He is New York Heart Association class I.  I was contemplating [facility] however with his lack of symptoms and ejection fraction 40% I think we can stay with ACE inhibitor.  He is already on Aldactone which I will continue.  He is scheduled to see his primary care physician on Monday and he will have Chem-7 done. 2. Dyslipidemia he brought cholesterol profile that was done a month ago.  His LDL is 56 we will continue high intensity statin. 3. Coronary artery disease without any angina.  We will continue present medications.  I will see him back in 3 months or sooner if he has a problem   Medication Adjustments/Labs and Tests Ordered: Current medicines are reviewed at length with the patient today.  Concerns regarding medicines are outlined above.  No orders of the defined types were placed in this encounter.  Medication changes: No  orders of the defined types were placed in this encounter.   Signed, Georgeanna Leaobert J. Twanisha Foulk, MD, [facility]ACC 08/01/2017 9:31 AM    [facility]

## 2017-09-03 ENCOUNTER — Other Ambulatory Visit: Payer: Self-pay | Admitting: Physician Assistant

## 2017-11-04 ENCOUNTER — Ambulatory Visit: Payer: Medicare Other | Admitting: Cardiology

## 2017-12-16 ENCOUNTER — Encounter: Payer: Self-pay | Admitting: Cardiology

## 2017-12-16 ENCOUNTER — Ambulatory Visit: Payer: Medicare Other | Admitting: Cardiology

## 2017-12-16 VITALS — BP 118/60 | HR 45 | Ht 69.0 in | Wt 184.0 lb

## 2017-12-16 DIAGNOSIS — E876 Hypokalemia: Secondary | ICD-10-CM

## 2017-12-16 DIAGNOSIS — I2510 Atherosclerotic heart disease of native coronary artery without angina pectoris: Secondary | ICD-10-CM

## 2017-12-16 DIAGNOSIS — I255 Ischemic cardiomyopathy: Secondary | ICD-10-CM

## 2017-12-16 DIAGNOSIS — I493 Ventricular premature depolarization: Secondary | ICD-10-CM | POA: Diagnosis not present

## 2017-12-16 NOTE — Progress Notes (Signed)
Cardiology Office Note:    Date:  12/16/2017   ID:  Joseph Guerra, DOB [birth year], MRN [id number]  PCP:  Andreas Blowerabeza, Yuri M., MD  Cardiologist:  Gypsy Balsamobert Braydee Shimkus, MD    Referring MD: Andreas Blowerabeza, Yuri M., MD   Chief Complaint  Patient presents with  . Follow-up  Doing well  History of Present Illness:    Joseph ReaperStanislaw Alkema is a 72 y.o. male with coronary artery disease status post myocardial infarction with ejection fraction 40% based on last estimation clinically doing well denies have any chest pain tightness squeezing pressure burning chest.  Still very active goes to gym on the regular basis and exercise on the regular basis doing well  Past Medical History:  Diagnosis Date  . CAD (coronary artery disease)    a. prior MI in 2001 with stents placed at that time (patient unsure of which arteries)  . Hypercholesteremia   . Hypertension     Past Surgical History:  Procedure Laterality Date  . CORONARY ANGIOPLASTY WITH STENT PLACEMENT    . CORONARY BALLOON ANGIOPLASTY N/A 02/06/2017   Procedure: Coronary Balloon Angioplasty;  Surgeon: Iran OuchArida, Muhammad A, MD;  [facility]: [facility];  Service: Cardiovascular;  Laterality: N/A;  . LEFT HEART CATH AND CORONARY ANGIOGRAPHY N/A 02/06/2017   Procedure: Left Heart Cath and Coronary Angiography;  Surgeon: Iran OuchArida, Muhammad A, MD;  [facility];  Service: Cardiovascular;  Laterality: N/A;    Current Medications: Current Meds  Medication Sig  . aspirin 81 MG chewable tablet Chew 1 tablet (81 mg total) by mouth daily.  Marland Kitchen. atorvastatin (LIPITOR) 80 MG tablet Take 80 mg by mouth daily.  . carvedilol (COREG) 12.5 MG tablet Take 1 tablet (12.5 mg total) by mouth 2 (two) times daily with a meal.  . folic acid (FOLVITE) 400 MCG tablet Take 400 mcg by mouth daily.  . isosorbide mononitrate (IMDUR) 30 MG 24 hr tablet TAKE 1 TABLET BY MOUTH ONCE DAILY  . nitroGLYCERIN (NITROSTAT) 0.3 MG SL tablet Place 1 tablet (0.3 mg total) under  the tongue every 5 (five) minutes as needed for chest pain.  . ramipril (ALTACE) 5 MG capsule Take 1 capsule (5 mg total) by mouth daily.  Marland Kitchen. spironolactone (ALDACTONE) 25 MG tablet TAKE 1/2 (ONE-HALF) TABLET BY MOUTH TWICE DAILY  . ticagrelor (BRILINTA) 90 MG TABS tablet Take 1 tablet (90 mg total) by mouth 2 (two) times daily.     Allergies:   Patient has no known allergies.   Social History   Socioeconomic History  . Marital status: Married    Spouse name: Not on file  . Number of children: Not on file  . Years of education: Not on file  . Highest education level: Not on file  Occupational History  . Not on file  Social Needs  . Financial resource strain: Not on file  . Food insecurity:    Worry: Not on file    Inability: Not on file  . Transportation needs:    Medical: Not on file    Non-medical: Not on file  Tobacco Use  . Smoking status: Former Games developermoker  . Smokeless tobacco: Never Used  Substance and Sexual Activity  . Alcohol use: Yes    Comment: occ  . Drug use: No  . Sexual activity: Not on file  Lifestyle  . Physical activity:    Days per week: Not on file    Minutes per session: Not on file  . Stress: Not on file  Relationships  . Social connections:    Talks on phone: Not on file    Gets together: Not on file    Attends religious service: Not on file    Active member of club or organization: Not on file    Attends meetings of clubs or organizations: Not on file    Relationship status: Not on file  Other Topics Concern  . Not on file  Social History Narrative  . Not on file     Family History: The patient's family history includes Heart disease in his father. ROS:   Please see the history of present illness.    All 14 point review of systems negative except as described per history of present illness  EKGs/Labs/Other Studies Reviewed:      Recent Labs: 02/07/2017: Hemoglobin 12.7; Platelets 174 05/02/2017: BUN 17; Creatinine, Ser 0.99; Magnesium  2.3; Potassium 4.4; Sodium 140  Recent Lipid Panel    Component Value Date/Time   CHOL 117 03/26/2017 0913   TRIG 96 03/26/2017 0913   HDL 49 03/26/2017 0913   CHOLHDL 2.4 03/26/2017 0913   CHOLHDL 3.0 02/07/2017 0236   VLDL 15 02/07/2017 0236   LDLCALC 49 03/26/2017 0913    Physical Exam:    VS:  BP 118/60   Pulse (!) 45   Ht 5\' 9"  (1.753 m)   Wt 184 lb (83.5 kg)   SpO2 96%   BMI 27.17 kg/m     Wt Readings from Last 3 Encounters:  12/16/17 184 lb (83.5 kg)  08/01/17 184 lb 12.8 oz (83.8 kg)  05/02/17 184 lb (83.5 kg)     GEN:  Well nourished, well developed in no acute distress HEENT: Normal NECK: No JVD; No carotid bruits LYMPHATICS: No lymphadenopathy CARDIAC: RRR, no murmurs, no rubs, no gallops RESPIRATORY:  Clear to auscultation without rales, wheezing or rhonchi  ABDOMEN: Soft, non-tender, non-distended MUSCULOSKELETAL:  No edema; No deformity  SKIN: Warm and dry LOWER EXTREMITIES: no swelling NEUROLOGIC:  Alert and oriented x 3 PSYCHIATRIC:  Normal affect   ASSESSMENT:    1. Hypokalemia   2. Coronary artery disease involving native coronary artery of native heart without angina pectoris   3. Ischemic cardiomyopathy   4. PVC's (premature ventricular contractions)    PLAN:    In order of problems listed above:  1. Coronary artery disease: Doing well from that point of view we will continue present management.  In 3 months he will have 1 year anniversary of the stent implantation of the for Brilinta will be discontinued. 2. Ischemic cardiomyopathy: Appropriate medications which I will continue. 3. PVCs: Denies having any dizziness or palpitations. 4. Dyslipidemia: Her last cholesterol profile was excellent.  I will check his Chem-7 today as well as to be seen   Medication Adjustments/Labs and Tests Ordered: Current medicines are reviewed at length with the patient today.  Concerns regarding medicines are outlined above.  Orders Placed This Encounter    Procedures  . Basic Metabolic Panel (BMET)  . CBC   Medication changes: No orders of the defined types were placed in this encounter.   Signed, Georgeanna Lea, MD, [facility] 12/16/2017 12:05 PM    [facility]

## 2017-12-16 NOTE — Patient Instructions (Signed)
Medication Instructions:  Your physician recommends that you continue on your current medications as directed. Please refer to the Current Medication list given to you today.   Labwork: Your physician recommends that you return for lab work today: CBC, BMP.   Testing/Procedures: None  Follow-Up: Your physician wants you to follow-up in: 3 months. You will receive a reminder letter in the mail two months in advance. If you don't receive a letter, please call our office to schedule the follow-up appointment.  Any Other Special Instructions Will Be Listed Below (If Applicable).     If you need a refill on your cardiac medications before your next appointment, please call your pharmacy.

## 2017-12-17 LAB — CBC
Hematocrit: 40 % (ref 37.5–51.0)
Hemoglobin: 13.1 g/dL (ref 13.0–17.7)
MCH: 31 pg (ref 26.6–33.0)
MCHC: 32.8 g/dL (ref 31.5–35.7)
MCV: 95 fL (ref 79–97)
PLATELETS: 218 10*3/uL (ref 150–379)
RBC: 4.23 x10E6/uL (ref 4.14–5.80)
RDW: 13.6 % (ref 12.3–15.4)
WBC: 5.5 10*3/uL (ref 3.4–10.8)

## 2017-12-17 LAB — BASIC METABOLIC PANEL
BUN / CREAT RATIO: 22 (ref 10–24)
BUN: 18 mg/dL (ref 8–27)
CALCIUM: 9.1 mg/dL (ref 8.6–10.2)
CHLORIDE: 102 mmol/L (ref 96–106)
CO2: 23 mmol/L (ref 20–29)
Creatinine, Ser: 0.81 mg/dL (ref 0.76–1.27)
GFR calc Af Amer: 105 mL/min/{1.73_m2} (ref >59)
GFR, EST NON AFRICAN AMERICAN: 91 mL/min/{1.73_m2} (ref >59)
Glucose: 99 mg/dL (ref 65–99)
POTASSIUM: 5.2 mmol/L (ref 3.5–5.2)
SODIUM: 138 mmol/L (ref 134–144)

## 2018-02-15 ENCOUNTER — Other Ambulatory Visit: Payer: Self-pay | Admitting: Physician Assistant

## 2018-03-03 ENCOUNTER — Other Ambulatory Visit: Payer: Self-pay | Admitting: Cardiology

## 2018-03-11 ENCOUNTER — Other Ambulatory Visit: Payer: Self-pay | Admitting: *Deleted

## 2018-03-11 ENCOUNTER — Other Ambulatory Visit: Payer: Self-pay | Admitting: Cardiology

## 2018-03-11 MED ORDER — SPIRONOLACTONE 25 MG PO TABS: ORAL_TABLET | ORAL | 2 refills | Status: DC

## 2018-03-13 ENCOUNTER — Other Ambulatory Visit: Payer: Self-pay | Admitting: Physician Assistant

## 2018-03-14 ENCOUNTER — Other Ambulatory Visit: Payer: Self-pay | Admitting: Cardiology

## 2018-03-14 MED ORDER — RAMIPRIL 5 MG PO CAPS: 5.0000 mg | ORAL_CAPSULE | Freq: Every day | ORAL | 3 refills | Status: DC

## 2018-03-14 NOTE — Telephone Encounter (Signed)
Refills have been sent.  

## 2018-03-14 NOTE — Telephone Encounter (Signed)
°  He is currently out of this medication   1. Which medications need to be refilled? (please list name of each medication and dose if known) ramipril 5 mg   2. Which pharmacy/location (including street and city if local pharmacy) is medication to be sent to?[facility] [address] [place]  3. Do they need a 30 day or 90 day supply? 90 day supply

## 2018-03-20 ENCOUNTER — Ambulatory Visit: Payer: Medicare Other | Admitting: Cardiology

## 2018-03-28 ENCOUNTER — Encounter: Payer: Self-pay | Admitting: Cardiology

## 2018-03-28 ENCOUNTER — Ambulatory Visit: Payer: Medicare Other | Admitting: Cardiology

## 2018-03-28 VITALS — BP 112/64 | HR 56 | Ht 69.0 in | Wt 187.8 lb

## 2018-03-28 DIAGNOSIS — E876 Hypokalemia: Secondary | ICD-10-CM | POA: Diagnosis not present

## 2018-03-28 DIAGNOSIS — I255 Ischemic cardiomyopathy: Secondary | ICD-10-CM | POA: Diagnosis not present

## 2018-03-28 DIAGNOSIS — I2510 Atherosclerotic heart disease of native coronary artery without angina pectoris: Secondary | ICD-10-CM

## 2018-03-28 DIAGNOSIS — I10 Essential (primary) hypertension: Secondary | ICD-10-CM | POA: Diagnosis not present

## 2018-03-28 DIAGNOSIS — E785 Hyperlipidemia, unspecified: Secondary | ICD-10-CM

## 2018-03-28 NOTE — Progress Notes (Signed)
Cardiology Office Note:    Date:  03/28/2018   ID:  Joseph Guerra, DOB [birth year], MRN [id number]  PCP:  Andreas Blowerabeza, Yuri M., MD  Cardiologist:  Gypsy Balsamobert Britni Driscoll, MD    Referring MD: Andreas Blowerabeza, Yuri M., MD   Chief Complaint  Patient presents with  . Follow-up  Doing well  History of Present Illness:    Joseph Guerra is a 72 y.o. male with coronary artery disease, status post coronary artery restenting secondary to acute myocardial infarction.  Cardiomyopathy with ejection fraction 40%.  Overall doing great come back from vacation had a good time goes to gym on the regular basis eat well and overall he is doing well denies have any chest pain tightness squeezing pressure burning chest no dizziness no passing out  Past Medical History:  Diagnosis Date  . CAD (coronary artery disease)    a. prior MI in 2001 with stents placed at that time (patient unsure of which arteries)  . Hypercholesteremia   . Hypertension     Past Surgical History:  Procedure Laterality Date  . CORONARY ANGIOPLASTY WITH STENT PLACEMENT    . CORONARY BALLOON ANGIOPLASTY N/A 02/06/2017   Procedure: Coronary Balloon Angioplasty;  Surgeon: Iran OuchArida, Muhammad A, MD;  Location: [facility];  Service: Cardiovascular;  Laterality: N/A;  . LEFT HEART CATH AND CORONARY ANGIOGRAPHY N/A 02/06/2017   Procedure: Left Heart Cath and Coronary Angiography;  Surgeon: Iran OuchArida, Muhammad A, MD;  [facility];  Service: Cardiovascular;  Laterality: N/A;    Current Medications: Current Meds  Medication Sig  . aspirin 81 MG chewable tablet Chew 1 tablet (81 mg total) by mouth daily.  Marland Kitchen. atorvastatin (LIPITOR) 80 MG tablet Take 80 mg by mouth daily.  Marland Kitchen. BRILINTA 90 MG TABS tablet TAKE 1 TABLET BY MOUTH TWICE DAILY  . carvedilol (COREG) 12.5 MG tablet TAKE 1 TABLET BY MOUTH TWICE DAILY WITH MEALS  . folic acid (FOLVITE) 400 MCG tablet Take 400 mcg by mouth daily.  . isosorbide mononitrate (IMDUR) 30 MG 24 hr  tablet TAKE 1 TABLET BY MOUTH ONCE DAILY  . nitroGLYCERIN (NITROSTAT) 0.3 MG SL tablet Place 1 tablet (0.3 mg total) under the tongue every 5 (five) minutes as needed for chest pain.  . ramipril (ALTACE) 5 MG capsule Take 1 capsule (5 mg total) by mouth daily.  Marland Kitchen. spironolactone (ALDACTONE) 25 MG tablet TAKE 1/2 (ONE-HALF) TABLET BY MOUTH TWICE DAILY     Allergies:   Patient has no known allergies.   Social History   Socioeconomic History  . Marital status: Married    Spouse name: Not on file  . Number of children: Not on file  . Years of education: Not on file  . Highest education level: Not on file  Occupational History  . Not on file  Social Needs  . Financial resource strain: Not on file  . Food insecurity:    Worry: Not on file    Inability: Not on file  . Transportation needs:    Medical: Not on file    Non-medical: Not on file  Tobacco Use  . Smoking status: Former Games developermoker  . Smokeless tobacco: Never Used  Substance and Sexual Activity  . Alcohol use: Yes    Comment: occ  . Drug use: No  . Sexual activity: Not on file  Lifestyle  . Physical activity:    Days per week: Not on file    Minutes per session: Not on file  . Stress: Not on  file  Relationships  . Social connections:    Talks on phone: Not on file    Gets together: Not on file    Attends religious service: Not on file    Active member of club or organization: Not on file    Attends meetings of clubs or organizations: Not on file    Relationship status: Not on file  Other Topics Concern  . Not on file  Social History Narrative  . Not on file     Family History: The patient's family history includes Heart disease in his father. ROS:   Please see the history of present illness.    All 14 point review of systems negative except as described per history of present illness  EKGs/Labs/Other Studies Reviewed:      Recent Labs: 05/02/2017: Magnesium 2.3 12/16/2017: BUN 18; Creatinine, Ser 0.81;  Hemoglobin 13.1; Platelets 218; Potassium 5.2; Sodium 138  Recent Lipid Panel    Component Value Date/Time   CHOL 117 03/26/2017 0913   TRIG 96 03/26/2017 0913   HDL 49 03/26/2017 0913   CHOLHDL 2.4 03/26/2017 0913   CHOLHDL 3.0 02/07/2017 0236   VLDL 15 02/07/2017 0236   LDLCALC 49 03/26/2017 0913    Physical Exam:    VS:  BP 112/64 (BP Location: Right Arm, Patient Position: Sitting, Cuff Size: Normal)   Pulse (!) 56   Ht 5\' 9"  (1.753 m)   Wt 187 lb 12.8 oz (85.2 kg)   SpO2 95%   BMI 27.73 kg/m     Wt Readings from Last 3 Encounters:  03/28/18 187 lb 12.8 oz (85.2 kg)  12/16/17 184 lb (83.5 kg)  08/01/17 184 lb 12.8 oz (83.8 kg)     GEN:  Well nourished, well developed in no acute distress HEENT: Normal NECK: No JVD; No carotid bruits LYMPHATICS: No lymphadenopathy CARDIAC: RRR, no murmurs, no rubs, no gallops RESPIRATORY:  Clear to auscultation without rales, wheezing or rhonchi  ABDOMEN: Soft, non-tender, non-distended MUSCULOSKELETAL:  No edema; No deformity  SKIN: Warm and dry LOWER EXTREMITIES: no swelling NEUROLOGIC:  Alert and oriented x 3 PSYCHIATRIC:  Normal affect   ASSESSMENT:    1. Ischemic cardiomyopathy   2. Essential hypertension   3. Atherosclerosis of native coronary artery of native heart without angina pectoris   4. Hypokalemia   5. Dyslipidemia (high LDL; low HDL)    PLAN:    In order of problems listed above:  1. Ischemic cardia myopathy and appropriate medication I introduced him to topic of Sherryll Burger I will reach conclusion that we will repeat his echocardiogram in September if epicardium showed the same or improve ejection fraction we will continue present management if not we will switch him to [facility] continue with beta-blocker as well as Aldactone. 2. Essential hypertension blood pressure well controlled continue present management. 3. Hypokalemia: We will check his Chem-7 today 4. Dyslipidemia we will check his fasting lipid  profile today.   Medication Adjustments/Labs and Tests Ordered: Current medicines are reviewed at length with the patient today.  Concerns regarding medicines are outlined above.  No orders of the defined types were placed in this encounter.  Medication changes: No orders of the defined types were placed in this encounter.   Signed, Georgeanna Lea, MD, [facility] 03/28/2018 3:34 PM    [facility]

## 2018-03-28 NOTE — Patient Instructions (Signed)
Medication Instructions:  Your physician recommends that you continue on your current medications as directed. Please refer to the Current Medication list given to you today.   Labwork: Your physician recommends that you return for lab work today: BMP, lipid panel.   Testing/Procedures: Your physician has requested that you have an echocardiogram. Echocardiography is a painless test that uses sound waves to create images of your heart. It provides your doctor with information about the size and shape of your heart and how well your heart's chambers and valves are working. This procedure takes approximately one hour. There are no restrictions for this procedure. This will be scheduled for September.   Follow-Up: Your physician wants you to follow-up in: 5 months. You will receive a reminder letter in the mail two months in advance. If you don't receive a letter, please call our office to schedule the follow-up appointment.   If you need a refill on your cardiac medications before your next appointment, please call your pharmacy.   Thank you for choosing [facility]! Mady GemmaCatherine Lockhart, RN [phone]   Echocardiogram An echocardiogram, or echocardiography, uses sound waves (ultrasound) to produce an image of your heart. The echocardiogram is simple, painless, obtained within a short period of time, and offers valuable information to your health care provider. The images from an echocardiogram can provide information such as:  Evidence of coronary artery disease (CAD).  Heart size.  Heart muscle function.  Heart valve function.  Aneurysm detection.  Evidence of a past heart attack.  Fluid buildup around the heart.  Heart muscle thickening.  Assess heart valve function.  Tell a health care provider about:  Any allergies you have.  All medicines you are taking, including vitamins, herbs, eye drops, creams, and over-the-counter medicines.  Any problems you or family  members have had with anesthetic medicines.  Any blood disorders you have.  Any surgeries you have had.  Any medical conditions you have.  Whether you are pregnant or may be pregnant. What happens before the procedure? No special preparation is needed. Eat and drink normally. What happens during the procedure?  In order to produce an image of your heart, gel will be applied to your chest and a wand-like tool (transducer) will be moved over your chest. The gel will help transmit the sound waves from the transducer. The sound waves will harmlessly bounce off your heart to allow the heart images to be captured in real-time motion. These images will then be recorded.  You may need an IV to receive a medicine that improves the quality of the pictures. What happens after the procedure? You may return to your normal schedule including diet, activities, and medicines, unless your health care provider tells you otherwise. This information is not intended to replace advice given to you by your health care provider. Make sure you discuss any questions you have with your health care provider. Document Released: 08/10/2000 Document Revised: 03/31/2016 Document Reviewed: 04/20/2013 Elsevier Interactive Patient Education  2017 [employer]

## 2018-03-29 LAB — BASIC METABOLIC PANEL
BUN/Creatinine Ratio: 20 (ref 10–24)
BUN: 18 mg/dL (ref 8–27)
CHLORIDE: 103 mmol/L (ref 96–106)
CO2: 26 mmol/L (ref 20–29)
Calcium: 9.6 mg/dL (ref 8.6–10.2)
Creatinine, Ser: 0.91 mg/dL (ref 0.76–1.27)
GFR, EST AFRICAN AMERICAN: 99 mL/min/{1.73_m2} (ref >59)
GFR, EST NON AFRICAN AMERICAN: 86 mL/min/{1.73_m2} (ref >59)
Glucose: 95 mg/dL (ref 65–99)
POTASSIUM: 4.8 mmol/L (ref 3.5–5.2)
Sodium: 141 mmol/L (ref 134–144)

## 2018-03-29 LAB — LIPID PANEL
Chol/HDL Ratio: 3.1 ratio (ref 0.0–5.0)
Cholesterol, Total: 135 mg/dL (ref 100–199)
HDL: 43 mg/dL (ref >39)
LDL Calculated: 63 mg/dL (ref 0–99)
TRIGLYCERIDES: 147 mg/dL (ref 0–149)
VLDL CHOLESTEROL CAL: 29 mg/dL (ref 5–40)

## 2018-04-29 ENCOUNTER — Ambulatory Visit (HOSPITAL_BASED_OUTPATIENT_CLINIC_OR_DEPARTMENT_OTHER)
Admission: RE | Admit: 2018-04-29 | Discharge: 2018-04-29 | Disposition: A | Discharge status: Home / Self Care | Payer: Medicare Other | Source: Ambulatory Visit | Attending: Cardiology | Admitting: Cardiology

## 2018-04-29 DIAGNOSIS — E785 Hyperlipidemia, unspecified: Secondary | ICD-10-CM | POA: Diagnosis not present

## 2018-04-29 DIAGNOSIS — I700 Atherosclerosis of aorta: Secondary | ICD-10-CM | POA: Insufficient documentation

## 2018-04-29 DIAGNOSIS — I10 Essential (primary) hypertension: Secondary | ICD-10-CM | POA: Insufficient documentation

## 2018-04-29 DIAGNOSIS — I2510 Atherosclerotic heart disease of native coronary artery without angina pectoris: Secondary | ICD-10-CM

## 2018-04-29 DIAGNOSIS — I252 Old myocardial infarction: Secondary | ICD-10-CM | POA: Diagnosis not present

## 2018-04-29 DIAGNOSIS — I255 Ischemic cardiomyopathy: Secondary | ICD-10-CM | POA: Insufficient documentation

## 2018-04-29 MED ORDER — PERFLUTREN LIPID MICROSPHERE
1.0000 mL | INTRAVENOUS | Status: AC | PRN
  Administered 2018-04-29: 4 mL via INTRAVENOUS
  Filled 2018-04-29: qty 10

## 2018-04-29 NOTE — Progress Notes (Signed)
  Echocardiogram 2D Echocardiogram has been performed.  Aniqa Hare T Myia Bergh 04/29/2018, 11:55 AM

## 2018-04-30 ENCOUNTER — Other Ambulatory Visit (HOSPITAL_BASED_OUTPATIENT_CLINIC_OR_DEPARTMENT_OTHER): Payer: Medicare Other

## 2018-04-30 ENCOUNTER — Other Ambulatory Visit: Payer: Self-pay | Admitting: Emergency Medicine

## 2018-04-30 NOTE — Telephone Encounter (Signed)
Patient's wife informed of results. 

## 2018-08-18 ENCOUNTER — Other Ambulatory Visit: Payer: Self-pay | Admitting: Cardiology

## 2018-09-22 ENCOUNTER — Ambulatory Visit (INDEPENDENT_AMBULATORY_CARE_PROVIDER_SITE_OTHER): Payer: Medicare Other | Admitting: Cardiology

## 2018-09-22 ENCOUNTER — Encounter: Payer: Self-pay | Admitting: Cardiology

## 2018-09-22 VITALS — BP 126/68 | HR 66 | Ht 69.0 in | Wt 190.8 lb

## 2018-09-22 DIAGNOSIS — I2510 Atherosclerotic heart disease of native coronary artery without angina pectoris: Secondary | ICD-10-CM | POA: Diagnosis not present

## 2018-09-22 DIAGNOSIS — I493 Ventricular premature depolarization: Secondary | ICD-10-CM

## 2018-09-22 DIAGNOSIS — E785 Hyperlipidemia, unspecified: Secondary | ICD-10-CM

## 2018-09-22 DIAGNOSIS — I255 Ischemic cardiomyopathy: Secondary | ICD-10-CM

## 2018-09-22 NOTE — Patient Instructions (Signed)
Medication Instructions:  Your physician recommends that you continue on your current medications as directed. Please refer to the Current Medication list given to you today.  If you need a refill on your cardiac medications before your next appointment, please call your pharmacy.   Lab work: None ordered If you have labs (blood work) drawn today and your tests are completely normal, you will receive your results only by: . MyChart Message (if you have MyChart) OR . A paper copy in the mail If you have any lab test that is abnormal or we need to change your treatment, we will call you to review the results.  Testing/Procedures: None ordered  Follow-Up: At [facility], you and your health needs are our priority.  As part of our continuing mission to provide you with exceptional heart care, we have created designated Provider Care Teams.  These Care Teams include your primary Cardiologist (physician) and Advanced Practice Providers (APPs -  Physician Assistants and Nurse Practitioners) who all work together to provide you with the care you need, when you need it. You will need a follow up appointment in 6 months.  Please call our office 2 months in advance to schedule this appointment.  You may see  Robert Krasowski or another member of our [facility] Provider Team in [place]: Brian Munley, MD . Rajan Revankar, MD  Any Other Special Instructions Will Be Listed Below (If Applicable).    

## 2018-09-22 NOTE — Progress Notes (Signed)
Cardiology Office Note:    Date:  09/22/2018   ID:  Joseph Guerra, DOB [birth year], MRN [id number]  PCP:  Andreas Blower., MD  Cardiologist:  Gypsy Balsam, MD    Referring MD: Andreas Blower., MD   Chief Complaint  Patient presents with  . Follow-up  Doing very well  History of Present Illness:    Joseph Guerra is a 73 y.o. male with coronary artery disease status post myocardial infarction, status post PTCA and stenting last time done in 2017.  Doing well from that point of view denies have any chest pain tightness squeezing pressure burning chest.  He goes to gym on the regular basis exercise for at least half an hour to 45 minutes with no difficulties.  Denies having any palpitations also when he measured his heart rate on the machine while exercising there is no more skipped beats previously he did have that no syncope no dizziness no passing out.  Past Medical History:  Diagnosis Date  . CAD (coronary artery disease)    a. prior MI in 2001 with stents placed at that time (patient unsure of which arteries)  . Hypercholesteremia   . Hypertension     Past Surgical History:  Procedure Laterality Date  . CORONARY ANGIOPLASTY WITH STENT PLACEMENT    . CORONARY BALLOON ANGIOPLASTY N/A 02/06/2017   Procedure: Coronary Balloon Angioplasty;  Surgeon: Iran Ouch, MD;  Location: [facility] INVASIVE CV LAB;  Service: Cardiovascular;  Laterality: N/A;  . LEFT HEART CATH AND CORONARY ANGIOGRAPHY N/A 02/06/2017   Procedure: Left Heart Cath and Coronary Angiography;  Surgeon: Iran Ouch, MD;  Location: [facility] INVASIVE CV LAB;  Service: Cardiovascular;  Laterality: N/A;    Current Medications: Current Meds  Medication Sig  . aspirin 81 MG chewable tablet Chew 1 tablet (81 mg total) by mouth daily.  Marland Kitchen atorvastatin (LIPITOR) 80 MG tablet Take 80 mg by mouth daily.  Marland Kitchen BRILINTA 90 MG TABS tablet TAKE 1 TABLET BY MOUTH TWICE DAILY  . carvedilol (COREG) 12.5 MG tablet TAKE 1 TABLET  BY MOUTH TWICE DAILY WITH MEALS  . folic acid (FOLVITE) 400 MCG tablet Take 400 mcg by mouth daily.  . isosorbide mononitrate (IMDUR) 30 MG 24 hr tablet TAKE 1 TABLET BY MOUTH ONCE DAILY  . nitroGLYCERIN (NITROSTAT) 0.3 MG SL tablet Place 1 tablet (0.3 mg total) under the tongue every 5 (five) minutes as needed for chest pain.  . ramipril (ALTACE) 5 MG capsule Take 1 capsule (5 mg total) by mouth daily.  Marland Kitchen spironolactone (ALDACTONE) 25 MG tablet TAKE 1/2 (ONE-HALF) TABLET BY MOUTH TWICE DAILY     Allergies:   Patient has no known allergies.   Social History   Socioeconomic History  . Marital status: Married    Spouse name: Not on file  . Number of children: Not on file  . Years of education: Not on file  . Highest education level: Not on file  Occupational History  . Not on file  Social Needs  . Financial resource strain: Not on file  . Food insecurity:    Worry: Not on file    Inability: Not on file  . Transportation needs:    Medical: Not on file    Non-medical: Not on file  Tobacco Use  . Smoking status: Former Games developer  . Smokeless tobacco: Never Used  Substance and Sexual Activity  . Alcohol use: Yes    Comment: occ  . Drug use: No  . Sexual activity:  Not on file  Lifestyle  . Physical activity:    Days per week: Not on file    Minutes per session: Not on file  . Stress: Not on file  Relationships  . Social connections:    Talks on phone: Not on file    Gets together: Not on file    Attends religious service: Not on file    Active member of club or organization: Not on file    Attends meetings of clubs or organizations: Not on file    Relationship status: Not on file  Other Topics Concern  . Not on file  Social History Narrative  . Not on file     Family History: The patient's family history includes Heart disease in his father. ROS:   Please see the history of present illness.    All 14 point review of systems negative except as described per history of  present illness  EKGs/Labs/Other Studies Reviewed:      Recent Labs: 12/16/2017: Hemoglobin 13.1; Platelets 218 03/28/2018: BUN 18; Creatinine, Ser 0.91; Potassium 4.8; Sodium 141  Recent Lipid Panel    Component Value Date/Time   CHOL 135 03/28/2018 1545   TRIG 147 03/28/2018 1545   HDL 43 03/28/2018 1545   CHOLHDL 3.1 03/28/2018 1545   CHOLHDL 3.0 02/07/2017 0236   VLDL 15 02/07/2017 0236   LDLCALC 63 03/28/2018 1545    Physical Exam:    VS:  BP 126/68   Pulse 66   Ht 5\' 9"  (1.753 m)   Wt 190 lb 12.8 oz (86.5 kg)   SpO2 96%   BMI 28.18 kg/m     Wt Readings from Last 3 Encounters:  09/22/18 190 lb 12.8 oz (86.5 kg)  03/28/18 187 lb 12.8 oz (85.2 kg)  12/16/17 184 lb (83.5 kg)     GEN:  Well nourished, well developed in no acute distress HEENT: Normal NECK: No JVD; No carotid bruits LYMPHATICS: No lymphadenopathy CARDIAC: RRR, no murmurs, no rubs, no gallops RESPIRATORY:  Clear to auscultation without rales, wheezing or rhonchi  ABDOMEN: Soft, non-tender, non-distended MUSCULOSKELETAL:  No edema; No deformity  SKIN: Warm and dry LOWER EXTREMITIES: no swelling NEUROLOGIC:  Alert and oriented x 3 PSYCHIATRIC:  Normal affect   ASSESSMENT:    1. Ischemic cardiomyopathy   2. Dyslipidemia (high LDL; low HDL)   3. Atherosclerosis of native coronary artery of native heart without angina pectoris   4. PVC's (premature ventricular contractions)    PLAN:    In order of problems listed above:  1. Coronary disease ischemic cardiomyopathy overall doing well on appropriate medications doing well I wanted to switch him to [facility] he does not want to the plan that we have is to wait until September when echocardiogram will be repeated and then if his left ventricular ejection fraction will be worse then will reconsider changing to [facility] until then we will continue present management. 2. Dyslipidemia last cholesterol was perfect.  I will check his cholesterol before next  visit. 3. PVCs denies having any.   Medication Adjustments/Labs and Tests Ordered: Current medicines are reviewed at length with the patient today.  Concerns regarding medicines are outlined above.  No orders of the defined types were placed in this encounter.  Medication changes: No orders of the defined types were placed in this encounter.   Signed, Georgeanna Lea, MD, [facility] 09/22/2018 4:16 PM    [facility]

## 2018-11-24 ENCOUNTER — Other Ambulatory Visit: Payer: Self-pay | Admitting: Cardiology

## 2018-12-15 ENCOUNTER — Other Ambulatory Visit: Payer: Self-pay | Admitting: Cardiology

## 2018-12-15 NOTE — Telephone Encounter (Signed)
Spironolactone sent to [facility] [address]

## 2018-12-27 IMAGING — CR DG CHEST 2V
2 series · 2 of 2 positions shown · non-contrast
Comparison: None in PACs

CLINICAL DATA: Onset of chest tightness this morning. Former
smoker. History of previous MI.

EXAM:
CHEST  2 VIEW

[w chest pa]
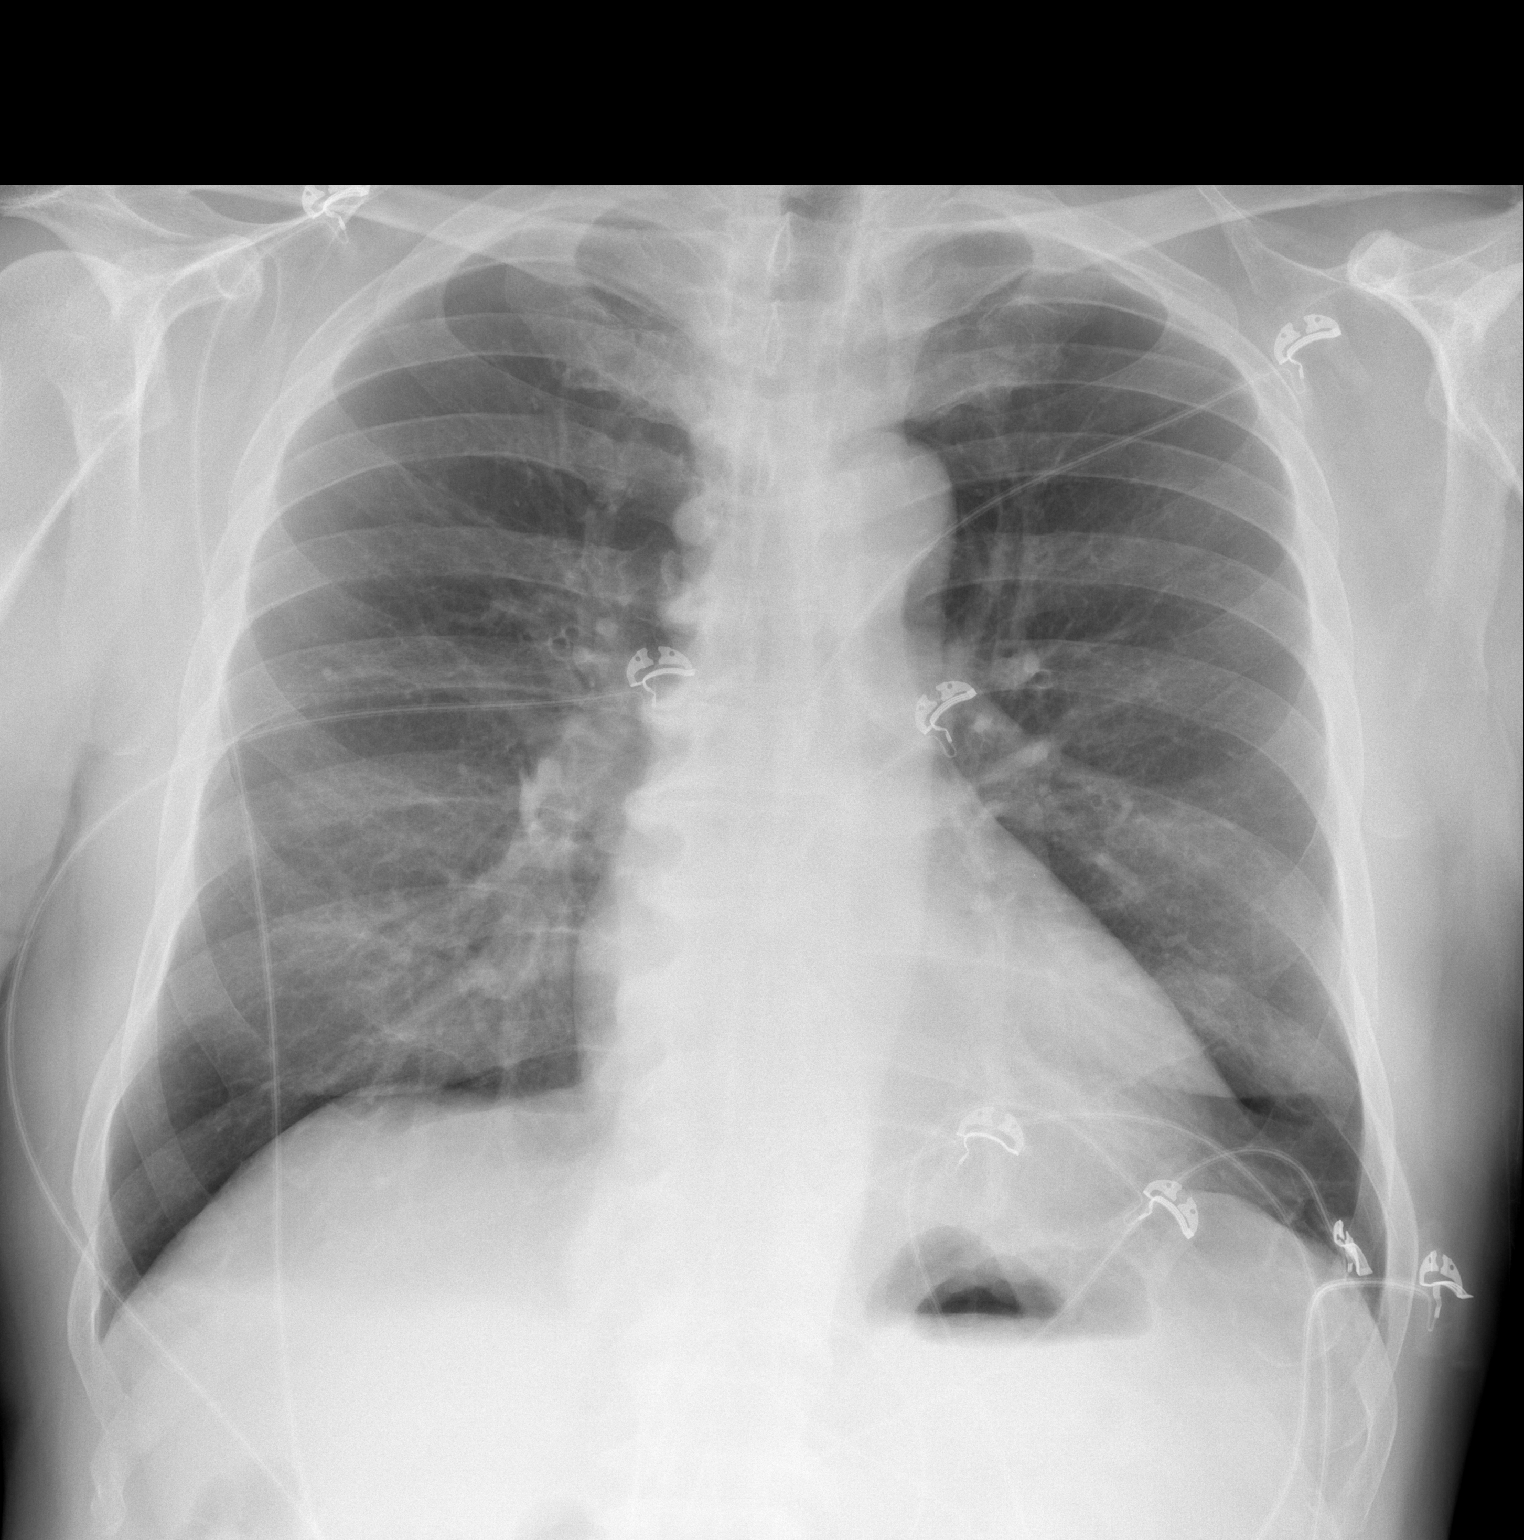

[w chest lat]
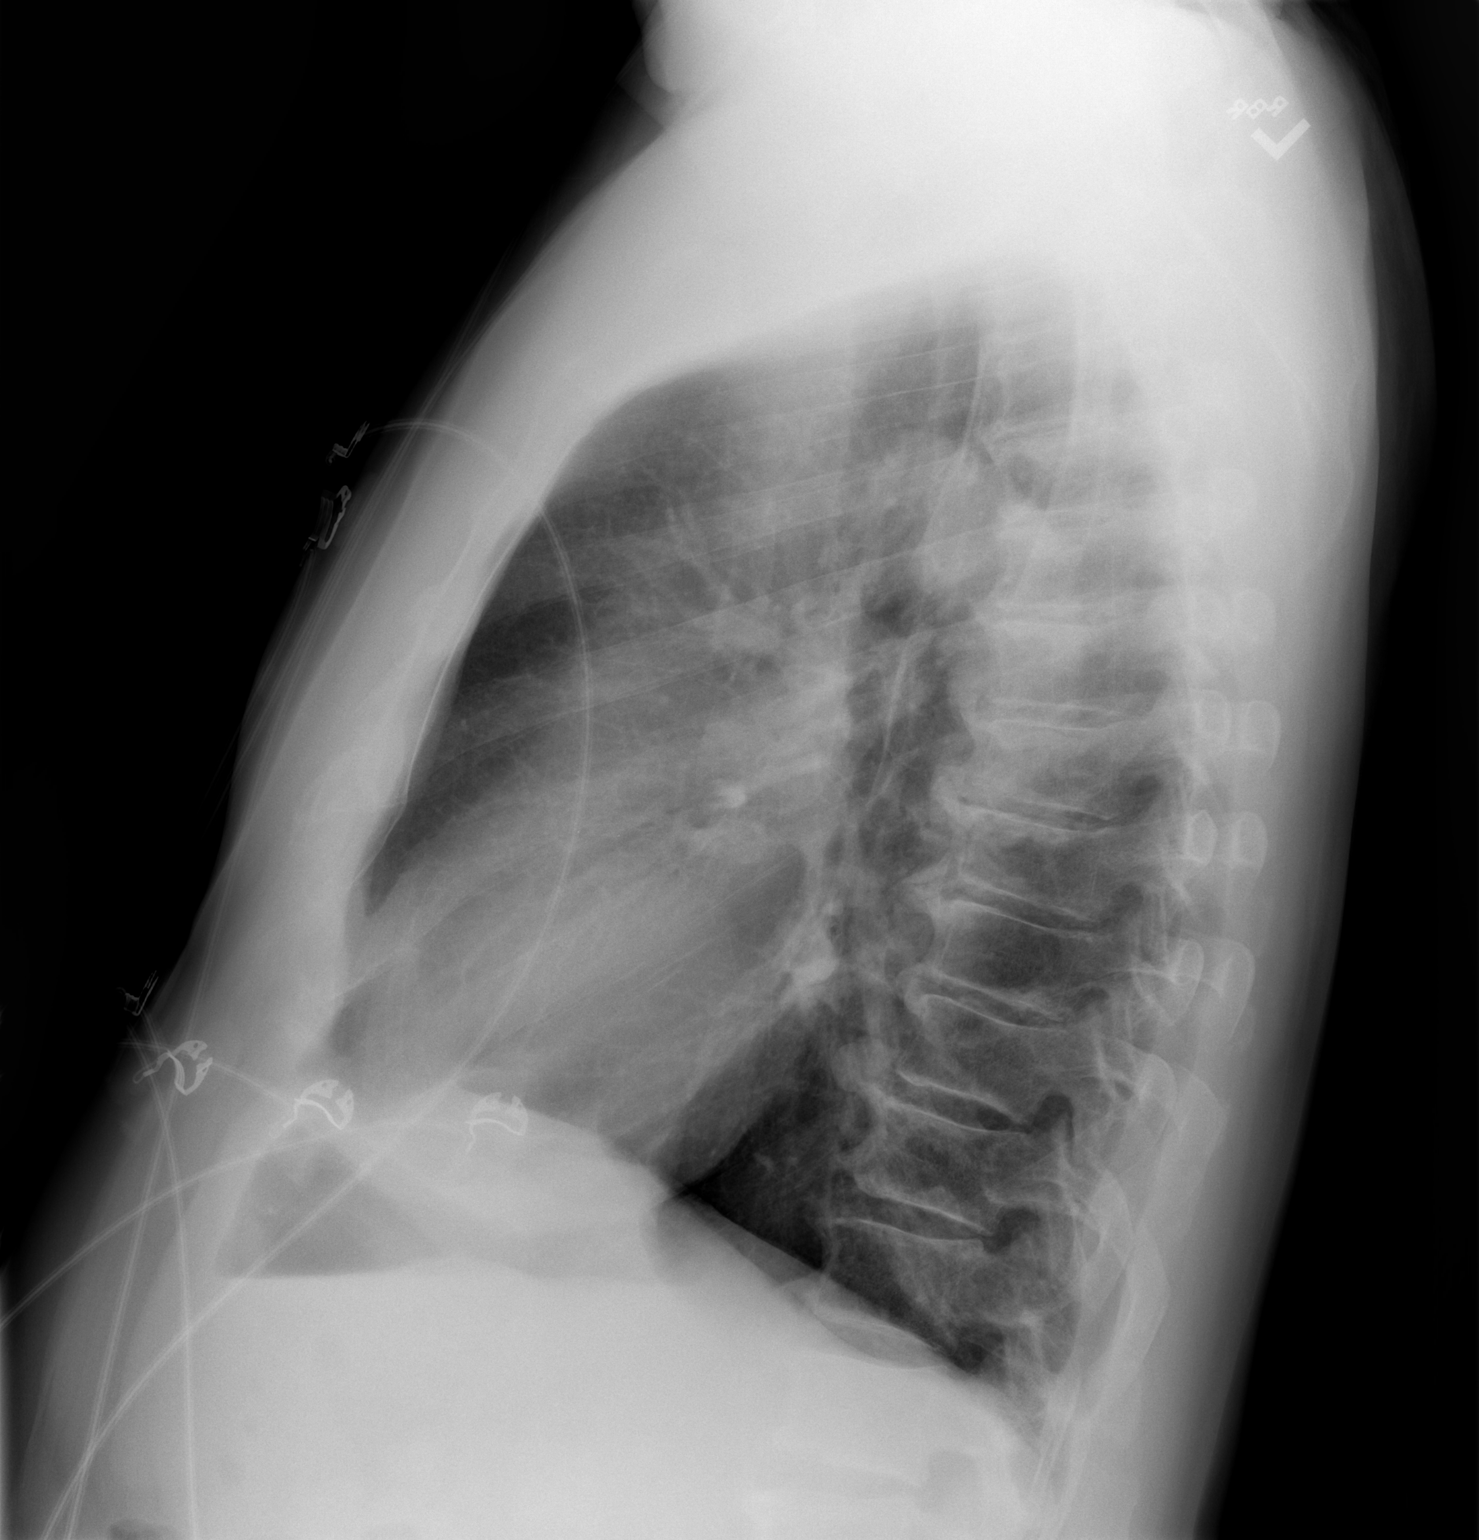

[2 of 2 positions shown; findings below may reference images not displayed]

FINDINGS: The lungs are well-expanded with mild hemidiaphragm flattening. The
heart and pulmonary vascularity are normal. The mediastinum is
normal in width. There is no pleural effusion or pneumothorax. There
is multilevel degenerative disc disease of the thoracic spine.
IMPRESSION: Mild hyperinflation may be voluntary or may reflect the patient's
smoking history. No pneumonia, pulmonary edema, nor other acute
cardiopulmonary abnormality.

Thoracic spine osteoarthritis.

## 2019-02-23 ENCOUNTER — Other Ambulatory Visit: Payer: Self-pay | Admitting: Cardiology

## 2019-03-01 ENCOUNTER — Other Ambulatory Visit: Payer: Self-pay | Admitting: Cardiology

## 2019-03-06 ENCOUNTER — Encounter: Payer: Self-pay | Admitting: Cardiology

## 2019-03-06 ENCOUNTER — Other Ambulatory Visit: Payer: Self-pay

## 2019-03-06 ENCOUNTER — Ambulatory Visit (INDEPENDENT_AMBULATORY_CARE_PROVIDER_SITE_OTHER): Payer: Medicare Other | Admitting: Cardiology

## 2019-03-06 VITALS — BP 108/66 | HR 53 | Wt 182.4 lb

## 2019-03-06 DIAGNOSIS — I2510 Atherosclerotic heart disease of native coronary artery without angina pectoris: Secondary | ICD-10-CM

## 2019-03-06 DIAGNOSIS — I10 Essential (primary) hypertension: Secondary | ICD-10-CM | POA: Diagnosis not present

## 2019-03-06 DIAGNOSIS — E785 Hyperlipidemia, unspecified: Secondary | ICD-10-CM | POA: Diagnosis not present

## 2019-03-06 DIAGNOSIS — I255 Ischemic cardiomyopathy: Secondary | ICD-10-CM

## 2019-03-06 MED ORDER — RAMIPRIL 5 MG PO CAPS: 5.0000 mg | ORAL_CAPSULE | Freq: Every day | ORAL | 3 refills | Status: DC

## 2019-03-06 MED ORDER — SPIRONOLACTONE 25 MG PO TABS: 12.5000 mg | ORAL_TABLET | Freq: Two times a day (BID) | ORAL | 3 refills | Status: DC

## 2019-03-06 MED ORDER — NITROGLYCERIN 0.3 MG SL SUBL: 0.3000 mg | SUBLINGUAL_TABLET | SUBLINGUAL | 1 refills | Status: DC | PRN

## 2019-03-06 MED ORDER — ISOSORBIDE MONONITRATE ER 30 MG PO TB24: 30.0000 mg | ORAL_TABLET | Freq: Every day | ORAL | 3 refills | Status: DC

## 2019-03-06 MED ORDER — ASPIRIN 81 MG PO CHEW: 81.0000 mg | CHEWABLE_TABLET | Freq: Every day | ORAL | 11 refills | Status: DC

## 2019-03-06 NOTE — Patient Instructions (Addendum)
Medication Instructions:  All medications have been refilled.   If you need a refill on your cardiac medications before your next appointment, please call your pharmacy.   Lab work:  Please come for a FASTING Lipid profile next week. You do not need to make an appointment. Come anytime 8a-5p but not 12p-1p during the lunch hour. Please do not eat/drink other than water 8hr before this test. It is recommended to purchase in the morning.   If you have labs (blood work) drawn today and your tests are completely normal, you will receive your results only by: Marland Kitchen. MyChart Message (if you have MyChart) OR . A paper copy in the mail If you have any lab test that is abnormal or we need to change your treatment, we will call you to review the results.  Testing/Procedures: Echocardiogram in September.    Follow-Up: At [facility], you and your health needs are our priority.  As part of our continuing mission to provide you with exceptional heart care, we have created designated Provider Care Teams.  These Care Teams include your primary Cardiologist (physician) and Advanced Practice Providers (APPs -  Physician Assistants and Nurse Practitioners) who all work together to provide you with the care you need, when you need it. . Follow up with Dr. Bing MatterKrasowski in our [place] office in 6 months. Please call the office 2 months prior to schedule this appointment.   Any Other Special Instructions Will Be Listed Below (If Applicable).    Lipid Profile Test Why am I having this test? The lipid profile test can be used to help evaluate your risk for developing heart disease. The test is also used to monitor your levels during treatment for high cholesterol to see if you are reaching your goals. What is being tested? A lipid profile measures the following:  Total cholesterol. Cholesterol is a waxy, fat-like substance in your blood. If your total cholesterol level is high, this can increase your risk for heart  disease.  High-density lipoprotein (HDL). This is known as the good cholesterol. Having high levels of HDL decreases your risk for heart disease. Your HDL level may be low if you smoke or do not get enough exercise.  Low-density lipoprotein (LDL). This is known as the bad cholesterol. This type causes plaque to build up in your arteries. Having a low level of LDL is best. Having high levels of LDL increases your risk for heart disease.  Cholesterol to HDL ratio. This is calculated by dividing your total cholesterol by your HDL cholesterol. The ratio is used by health care providers to determine your risk for heart disease. A low ratio is best.  Triglycerides. These are fats that your body can store or burn for energy. Low levels are best. Having high levels of triglycerides increases your risk for heart disease. What kind of sample is taken?  A blood sample is required for this test. It is usually collected by inserting a needle into a blood vessel. How do I prepare for this test? Do not drink alcohol starting at least 24 hours before your test. Follow any instructions from your health care provider about dietary restrictions before your test. Do not eat or drink anything other than water after midnight on the night before the test, or as told by your health care provider. Tell a health care provider about:  All medicines you are taking, including vitamins, herbs, eye drops, creams, and over-the-counter medicines.  Any medical conditions you have.  Whether you  are pregnant or may be pregnant. How are the results reported? Your test results will be reported as values that indicate your cholesterol and triglyceride levels. Your health care provider will compare your results to normal ranges that were established after testing a large group of people (reference ranges). Reference ranges may vary among labs and hospitals. For this test, common reference ranges are: Total cholesterol  Adult or  elderly: less than 200 mg/dL.  Child: 120-200 mg/dL.  Infant: 70-175 mg/dL.  Newborn: 53-135 mg/dL. HDL  Male: greater than 45 mg/dL.  Male: greater than 55 mg/dL. HDL reference values based on your risk for heart disease:  Low risk for heart disease: ? Male: 60 mg/dL. ? Male: 70 mg/dL.  Moderate risk for heart disease: ? Male: 45 mg/dL. ? Male: 55 mg/dL.  High risk for heart disease: ? Male: 25 mg/dL. ? Male: 35 mg/dL. LDL  Adults: Your health care provider will determine a target level for LDL based on your risk for heart disease. ? If you are at low risk, your LDL should be 130 mg/dL or less. ? If you are at moderate risk, your LDL should be 100 mg/dL or less. ? If you are at high risk, your LDL should be 70 mg/dL or less.  Children: less than 110 mg/dL. Cholesterol to HDL ratio Reference values based on your risk for heart disease:  Risk that is half the average risk: ? Male: 3.4. ? Male: 3.3.  Average risk: ? Male: 5.0. ? Male: 4.4.  Risk that is two times average (moderate risk): ? Male: 10.0. ? Male: 7.0.  Risk that is three times average (high risk): ? Male: 24.0. ? Male: 11.0. Triglycerides  Adult or elderly: ? Male: 40-160 mg/dL. ? Male: 35-135 mg/dL.  Children 54-38 years old: ? Male: 40-163 mg/dL. ? Male: 40-128 mg/dL.  Children 33-2 years old: ? Male: 36-138 mg/dL. ? Male: 41-138 mg/dL.  Children 2-39 years old: ? Male: 31-108 mg/dL. ? Male: 35-114 mg/dL.  Children 60-35 years old: ? Male: 30-86 mg/dL. ? Male: 32-99 mg/dL. Triglycerides should be less than 400 mg/dL even when you are not fasting. What do the results mean? Results that are within the reference ranges are considered normal. Total cholesterol, LDL, and triglyceride levels that are higher than the reference ranges can mean that you have an increased risk for heart disease. An HDL level that is lower than the reference range can also indicate  an increased risk. Talk with your health care provider about what your results mean. Questions to ask your health care provider Ask your health care provider, or the department that is doing the test:  When will my results be ready?  How will I get my results?  What are my treatment options?  What other tests do I need?  What are my next steps? Summary  The lipid profile test can be used to help predict the likelihood that you will develop heart disease. It can also help monitor your cholesterol levels during treatment.  A lipid profile measures your total cholesterol, high-density lipoprotein (HDL), low-density lipoprotein (LDL), cholesterol to HDL ratio, and triglycerides.  Total cholesterol, LDL, and triglyceride levels that are higher than the reference ranges can indicate an increased risk for heart disease.  An HDL level that is lower than the reference range can indicate an increased risk for heart disease.  Talk with your health care provider about what your results mean. This information is not intended to replace advice given  to you by your health care provider. Make sure you discuss any questions you have with your health care provider. Document Released: 09/06/2004 Document Revised: 05/20/2017 Document Reviewed: 05/20/2017 Elsevier Patient Education  2020 [employer]  Echocardiogram An echocardiogram is a procedure that uses painless sound waves (ultrasound) to produce an image of the heart. Images from an echocardiogram can provide important information about:  Signs of coronary artery disease (CAD).  Aneurysm detection. An aneurysm is a weak or damaged part of an artery wall that bulges out from the normal force of blood pumping through the body.  Heart size and shape. Changes in the size or shape of the heart can be associated with certain conditions, including heart failure, aneurysm, and CAD.  Heart muscle function.  Heart valve function.  Signs of a past  heart attack.  Fluid buildup around the heart.  Thickening of the heart muscle.  A tumor or infectious growth around the heart valves. Tell a health care provider about:  Any allergies you have.  All medicines you are taking, including vitamins, herbs, eye drops, creams, and over-the-counter medicines.  Any blood disorders you have.  Any surgeries you have had.  Any medical conditions you have.  Whether you are pregnant or may be pregnant. What are the risks? Generally, this is a safe procedure. However, problems may occur, including:  Allergic reaction to dye (contrast) that may be used during the procedure. What happens before the procedure? No specific preparation is needed. You may eat and drink normally. What happens during the procedure?   An IV tube may be inserted into one of your veins.  You may receive contrast through this tube. A contrast is an injection that improves the quality of the pictures from your heart.  A gel will be applied to your chest.  A wand-like tool (transducer) will be moved over your chest. The gel will help to transmit the sound waves from the transducer.  The sound waves will harmlessly bounce off of your heart to allow the heart images to be captured in real-time motion. The images will be recorded on a computer. The procedure may vary among health care providers and hospitals. What happens after the procedure?  You may return to your normal, everyday life, including diet, activities, and medicines, unless your health care provider tells you not to do that. Summary  An echocardiogram is a procedure that uses painless sound waves (ultrasound) to produce an image of the heart.  Images from an echocardiogram can provide important information about the size and shape of your heart, heart muscle function, heart valve function, and fluid buildup around your heart.  You do not need to do anything to prepare before this procedure. You may eat  and drink normally.  After the echocardiogram is completed, you may return to your normal, everyday life, unless your health care provider tells you not to do that. This information is not intended to replace advice given to you by your health care provider. Make sure you discuss any questions you have with your health care provider. Document Released: 08/10/2000 Document Revised: 12/04/2018 Document Reviewed: 09/15/2016 Elsevier Patient Education  2020 [employer]

## 2019-03-06 NOTE — Progress Notes (Signed)
Cardiology Office Note:    Date:  03/06/2019   ID:  Joseph Guerra, DOB 07/26/1946, MRN [id number]  PCP:  Joseph Guerra, Joseph M., MD  Cardiologist:  Joseph Balsamobert Elane Peabody, MD    Referring MD: Joseph Guerra, Joseph M., MD   Chief Complaint  Patient presents with  . Follow-up  Doing well  History of Present Illness:    Joseph ReaperStanislaw Janoski is a 73 y.o. male with coronary artery disease status post myocardial infarction 2001, status post cardiac catheterization in June 2018 at that time he required stent to mid LAD as well as multiple stents to right coronary artery.  He did have myocardial infarction at the time.".  At that time seems to be occluded distal LAD at that time his ejection fraction was 30%.  Overall clinically he is doing very well denies having a chest pain tightness squeezing pressure burning chest.  He is very active he still walk climb stairs with no difficulties.  Try to exercise on a regular basis.  He is upset about coronavirus situation.  Past Medical History:  Diagnosis Date  . CAD (coronary artery disease)    a. prior MI in 2001 with stents placed at that time (patient unsure of which arteries)  . Hypercholesteremia   . Hypertension     Past Surgical History:  Procedure Laterality Date  . CORONARY ANGIOPLASTY WITH STENT PLACEMENT    . CORONARY BALLOON ANGIOPLASTY N/A 02/06/2017   Procedure: Coronary Balloon Angioplasty;  Surgeon: Iran OuchArida, Muhammad A, MD;  Location: [facility];  Service: Cardiovascular;  Laterality: N/A;  . LEFT HEART CATH AND CORONARY ANGIOGRAPHY N/A 02/06/2017   Procedure: Left Heart Cath and Coronary Angiography;  Surgeon: Iran OuchArida, Muhammad A, MD;  [facility];  Service: Cardiovascular;  Laterality: N/A;    Current Medications: Current Meds  Medication Sig  . aspirin 81 MG chewable tablet Chew 1 tablet (81 mg total) by mouth daily.  Marland Kitchen. atorvastatin (LIPITOR) 80 MG tablet Take 80 mg by mouth daily.  . carvedilol (COREG) 12.5 MG tablet TAKE  1 TABLET BY MOUTH TWICE DAILY WITH MEALS  . folic acid (FOLVITE) 400 MCG tablet Take 400 mcg by mouth daily.  . isosorbide mononitrate (IMDUR) 30 MG 24 hr tablet Take 1 tablet (30 mg total) by mouth daily.  . nitroGLYCERIN (NITROSTAT) 0.3 MG SL tablet Place 1 tablet (0.3 mg total) under the tongue every 5 (five) minutes as needed for chest pain.  . ramipril (ALTACE) 5 MG capsule Take 1 capsule (5 mg total) by mouth daily.  Marland Kitchen. spironolactone (ALDACTONE) 25 MG tablet Take 0.5 tablets (12.5 mg total) by mouth 2 (two) times daily.  . [DISCONTINUED] aspirin 81 MG chewable tablet Chew 1 tablet (81 mg total) by mouth daily.  . [DISCONTINUED] BRILINTA 90 MG TABS tablet TAKE 1 TABLET BY MOUTH TWICE DAILY  . [DISCONTINUED] isosorbide mononitrate (IMDUR) 30 MG 24 hr tablet Take 1 tablet by mouth once daily  . [DISCONTINUED] nitroGLYCERIN (NITROSTAT) 0.3 MG SL tablet Place 1 tablet (0.3 mg total) under the tongue every 5 (five) minutes as needed for chest pain.  . [DISCONTINUED] ramipril (ALTACE) 5 MG capsule Take 1 capsule (5 mg total) by mouth daily.  . [DISCONTINUED] spironolactone (ALDACTONE) 25 MG tablet Take 1/2 (one-half) tablet by mouth twice daily     Allergies:   Patient has no known allergies.   Social History   Socioeconomic History  . Marital status: Married    Spouse name: Not on file  . Number  of children: Not on file  . Years of education: Not on file  . Highest education level: Not on file  Occupational History  . Not on file  Social Needs  . Financial resource strain: Not on file  . Food insecurity    Worry: Not on file    Inability: Not on file  . Transportation needs    Medical: Not on file    Non-medical: Not on file  Tobacco Use  . Smoking status: Former Research scientist (life sciences)  . Smokeless tobacco: Never Used  Substance and Sexual Activity  . Alcohol use: Yes    Comment: occ  . Drug use: No  . Sexual activity: Not on file  Lifestyle  . Physical activity    Days per week: Not on  file    Minutes per session: Not on file  . Stress: Not on file  Relationships  . Social Herbalist on phone: Not on file    Gets together: Not on file    Attends religious service: Not on file    Active member of club or organization: Not on file    Attends meetings of clubs or organizations: Not on file    Relationship status: Not on file  Other Topics Concern  . Not on file  Social History Narrative  . Not on file     Family History: The patient's family history includes Heart disease in his father. ROS:   Please see the history of present illness.    All 14 point review of systems negative except as described per history of present illness  EKGs/Labs/Other Studies Reviewed:      Recent Labs: 03/28/2018: BUN 18; Creatinine, Ser 0.91; Potassium 4.8; Sodium 141  Recent Lipid Panel    Component Value Date/Time   CHOL 135 03/28/2018 1545   TRIG 147 03/28/2018 1545   HDL 43 03/28/2018 1545   CHOLHDL 3.1 03/28/2018 1545   CHOLHDL 3.0 02/07/2017 0236   VLDL 15 02/07/2017 0236   LDLCALC 63 03/28/2018 1545    Physical Exam:    VS:  BP 108/66   Pulse (!) 53   Wt 182 lb 6.4 oz (82.7 kg)   SpO2 98%   BMI 26.94 kg/m     Wt Readings from Last 3 Encounters:  03/06/19 182 lb 6.4 oz (82.7 kg)  09/22/18 190 lb 12.8 oz (86.5 kg)  03/28/18 187 lb 12.8 oz (85.2 kg)     GEN:  Well nourished, well developed in no acute distress HEENT: Normal NECK: No JVD; No carotid bruits LYMPHATICS: No lymphadenopathy CARDIAC: RRR, no murmurs, no rubs, no gallops RESPIRATORY:  Clear to auscultation without rales, wheezing or rhonchi  ABDOMEN: Soft, non-tender, non-distended MUSCULOSKELETAL:  No edema; No deformity  SKIN: Warm and dry LOWER EXTREMITIES: no swelling NEUROLOGIC:  Alert and oriented x 3 PSYCHIATRIC:  Normal affect   ASSESSMENT:    1. Dyslipidemia (high LDL; low HDL)   2. Essential hypertension   3. Coronary artery disease involving native coronary artery of  native heart without angina pectoris   4. Hyperlipidemia LDL goal <70   5. Ischemic cardiomyopathy    PLAN:    In order of problems listed above:  1. Dyslipidemia we will schedule him to have fasting lipid profile done. 2. Essential hypertension blood pressure well controlled continue present management. 3. Coronary disease stable without any symptoms. 4. Ischemic cardiomyopathy we will schedule him to have echocardiogram in September.  Last echocardiogram showed significant improvement in left  ventricular ejection fraction to 45 to 50%.  He does not want to take Entresto.   Medication Adjustments/Labs and Tests Ordered: Current medicines are reviewed at length with the patient today.  Concerns regarding medicines are outlined above.  Orders Placed This Encounter  Procedures  . Lipid Profile  . ECHOCARDIOGRAM COMPLETE   Medication changes:  Meds ordered this encounter  Medications  . aspirin 81 MG chewable tablet    Sig: Chew 1 tablet (81 mg total) by mouth daily.    Dispense:  30 tablet    Refill:  11    Order Specific Question:   Supervising Provider    Answer:   Baldo DaubMUNLEY, BRIAN J [id number]]  . isosorbide mononitrate (IMDUR) 30 MG 24 hr tablet    Sig: Take 1 tablet (30 mg total) by mouth daily.    Dispense:  90 tablet    Refill:  3    Order Specific Question:   Supervising Provider    Answer:   Baldo DaubMUNLEY, BRIAN J [id number]]  . nitroGLYCERIN (NITROSTAT) 0.3 MG SL tablet    Sig: Place 1 tablet (0.3 mg total) under the tongue every 5 (five) minutes as needed for chest pain.    Dispense:  25 tablet    Refill:  1    Order Specific Question:   Supervising Provider    Answer:   Baldo DaubMUNLEY, BRIAN J [id number]]  . ramipril (ALTACE) 5 MG capsule    Sig: Take 1 capsule (5 mg total) by mouth daily.    Dispense:  90 capsule    Refill:  3    Order Specific Question:   Supervising Provider    Answer:   Baldo DaubMUNLEY, BRIAN J [id number]]  . spironolactone (ALDACTONE) 25 MG tablet    Sig: Take 0.5 tablets  (12.5 mg total) by mouth 2 (two) times daily.    Dispense:  90 tablet    Refill:  3    Order Specific Question:   Supervising Provider    Answer:   Baldo DaubMUNLEY, BRIAN J [[id number]]    Signed, Georgeanna Leaobert J. Ayomide Purdy, MD, [facility] 03/06/2019 8:15 PM     [facility]

## 2019-03-12 ENCOUNTER — Other Ambulatory Visit: Payer: Self-pay

## 2019-03-12 DIAGNOSIS — E785 Hyperlipidemia, unspecified: Secondary | ICD-10-CM

## 2019-03-12 DIAGNOSIS — I2510 Atherosclerotic heart disease of native coronary artery without angina pectoris: Secondary | ICD-10-CM

## 2019-03-13 LAB — LIPID PANEL
Chol/HDL Ratio: 2.4 ratio (ref 0.0–5.0)
Cholesterol, Total: 130 mg/dL (ref 100–199)
HDL: 54 mg/dL (ref >39)
LDL Calculated: 61 mg/dL (ref 0–99)
Triglycerides: 74 mg/dL (ref 0–149)
VLDL Cholesterol Cal: 15 mg/dL (ref 5–40)

## 2019-03-16 ENCOUNTER — Telehealth: Payer: Self-pay | Admitting: Emergency Medicine

## 2019-03-16 NOTE — Telephone Encounter (Signed)
Left message for patient to return call for results.

## 2019-05-05 ENCOUNTER — Ambulatory Visit (HOSPITAL_BASED_OUTPATIENT_CLINIC_OR_DEPARTMENT_OTHER)
Admission: RE | Admit: 2019-05-05 | Discharge: 2019-05-05 | Disposition: A | Discharge status: Home / Self Care | Payer: Medicare Other | Source: Ambulatory Visit | Attending: Cardiology | Admitting: Cardiology

## 2019-05-05 ENCOUNTER — Other Ambulatory Visit: Payer: Self-pay

## 2019-05-05 DIAGNOSIS — I10 Essential (primary) hypertension: Secondary | ICD-10-CM | POA: Diagnosis not present

## 2019-05-05 DIAGNOSIS — E785 Hyperlipidemia, unspecified: Secondary | ICD-10-CM | POA: Diagnosis not present

## 2019-05-05 MED ORDER — PERFLUTREN LIPID MICROSPHERE
1.0000 mL | INTRAVENOUS | Status: AC | PRN
  Administered 2019-05-05: 3 mL via INTRAVENOUS
  Filled 2019-05-05: qty 10

## 2019-05-05 NOTE — Progress Notes (Signed)
  Echocardiogram 2D Echocardiogram has been performed.  Cardell Peach 05/05/2019, 2:28 PM

## 2019-05-18 ENCOUNTER — Other Ambulatory Visit: Payer: Self-pay | Admitting: Cardiology

## 2019-05-19 ENCOUNTER — Telehealth: Payer: Self-pay | Admitting: Cardiology

## 2019-05-19 NOTE — Telephone Encounter (Signed)
Returning [place] call about echo

## 2019-05-19 NOTE — Telephone Encounter (Signed)
Patient's wife, Julien Girt, per DPR informed of echocardiogram results and verbalized understanding. No further questions.

## 2019-08-24 ENCOUNTER — Other Ambulatory Visit: Payer: Self-pay | Admitting: Cardiology

## 2019-08-26 ENCOUNTER — Other Ambulatory Visit: Payer: Self-pay | Admitting: Cardiology

## 2019-08-26 MED ORDER — ATORVASTATIN CALCIUM 80 MG PO TABS: 80.0000 mg | ORAL_TABLET | Freq: Every day | ORAL | 0 refills | Status: DC

## 2019-08-26 NOTE — Telephone Encounter (Signed)
Lipitor 80 mg daily refilled.  

## 2019-08-26 NOTE — Telephone Encounter (Signed)
*  STAT* If patient is at the pharmacy, call can be transferred to refill team.   1. Which medications need to be refilled? (please list name of each medication and dose if known) atorvastatin (LIPITOR) 80 MG tablet  2. Which pharmacy/location (including street and city if local pharmacy) is medication to be sent to? [id number] - [place], [place] - [address] AT [facility] [address]  3. Do they need a 30 day or 90 day supply? 90 day   Patient is completely out of medication.

## 2019-09-21 ENCOUNTER — Ambulatory Visit (INDEPENDENT_AMBULATORY_CARE_PROVIDER_SITE_OTHER): Payer: Medicare Other | Admitting: Cardiology

## 2019-09-21 ENCOUNTER — Other Ambulatory Visit: Payer: Self-pay | Admitting: Cardiology

## 2019-09-21 ENCOUNTER — Other Ambulatory Visit: Payer: Self-pay

## 2019-09-21 ENCOUNTER — Encounter: Payer: Self-pay | Admitting: Cardiology

## 2019-09-21 VITALS — BP 132/70 | HR 60 | Ht 69.0 in | Wt 186.0 lb

## 2019-09-21 DIAGNOSIS — I2510 Atherosclerotic heart disease of native coronary artery without angina pectoris: Secondary | ICD-10-CM

## 2019-09-21 DIAGNOSIS — I10 Essential (primary) hypertension: Secondary | ICD-10-CM

## 2019-09-21 DIAGNOSIS — E785 Hyperlipidemia, unspecified: Secondary | ICD-10-CM

## 2019-09-21 NOTE — Addendum Note (Signed)
Addended by: Lily Kocher on: 09/21/2019 10:37 AM   Modules accepted: Orders

## 2019-09-21 NOTE — Addendum Note (Signed)
Addended by: Burnetta Sabin on: 09/21/2019 10:10 AM   Modules accepted: Orders

## 2019-09-21 NOTE — Patient Instructions (Addendum)
Medication Instructions:  Your physician recommends that you continue on your current medications as directed. Please refer to the Current Medication list given to you today.  *If you need a refill on your cardiac medications before your next appointment, please call your pharmacy*  Lab Work: TODAY:  LIPID / BMET  If you have labs (blood work) drawn today and your tests are completely normal, you will receive your results only by: Marland Kitchen MyChart Message (if you have MyChart) OR . A paper copy in the mail If you have any lab test that is abnormal or we need to change your treatment, we will call you to review the results.  Testing/Procedures: None ordered  Follow-Up: At [facility], you and your health needs are our priority.  As part of our continuing mission to provide you with exceptional heart care, we have created designated Provider Care Teams.  These Care Teams include your primary Cardiologist (physician) and Advanced Practice Providers (APPs -  Physician Assistants and Nurse Practitioners) who all work together to provide you with the care you need, when you need it.  Your next appointment:   6 month(s)  The format for your next appointment:   In Person  Provider:   Gypsy Balsam, MD  Other Instructions

## 2019-09-21 NOTE — Progress Notes (Signed)
Cardiology Office Note:    Date:  09/21/2019   ID:  Joseph Guerra, DOB 08/25/1946, MRN [id number]  PCP:  Kristopher Glee., MD  Cardiologist:  Jenne Campus, MD    Referring MD: Kristopher Glee., MD   Chief Complaint  Patient presents with  . Follow-up    History of Present Illness:    Joseph Guerra is a 74 y.o. male with past medical history significant for coronary artery disease, status post myocardial infarction 2001 as well as myocardial infarction in June 2018 that required stenting to LAD as well as multiple stent to right coronary artery.  Does have also history of cardiomyopathy with ejection fraction initially 30% and now latest estimation the summer 2020 of 40 to 45%.  Comes today to my office for follow-up.  Overall doing well.  Denies have any chest pain tightness squeezing pressure burning chest.  He exercised on the regular basis he walks with his wife at least half an hour to 45 minutes every single day.  Have no difficulty doing it.  No palpitations no dizziness no passing out.  Past Medical History:  Diagnosis Date  . CAD (coronary artery disease)    a. prior MI in 2001 with stents placed at that time (patient unsure of which arteries)  . Hypercholesteremia   . Hypertension     Past Surgical History:  Procedure Laterality Date  . CORONARY ANGIOPLASTY WITH STENT PLACEMENT    . CORONARY BALLOON ANGIOPLASTY N/A 02/06/2017   Procedure: Coronary Balloon Angioplasty;  Surgeon: Wellington Hampshire, MD;  Location: [place];  Service: Cardiovascular;  Laterality: N/A;  . LEFT HEART CATH AND CORONARY ANGIOGRAPHY N/A 02/06/2017   Procedure: Left Heart Cath and Coronary Angiography;  Surgeon: Wellington Hampshire, MD;  Location: [facility];  Service: Cardiovascular;  Laterality: N/A;    Current Medications: Current Meds  Medication Sig  . aspirin 81 MG chewable tablet Chew 1 tablet (81 mg total) by mouth daily.  Marland Kitchen atorvastatin (LIPITOR) 80 MG tablet  Take 1 tablet (80 mg total) by mouth daily.  . carvedilol (COREG) 12.5 MG tablet TAKE 1 TABLET BY MOUTH TWICE DAILY WITH MEALS  . folic acid (FOLVITE) 409 MCG tablet Take 400 mcg by mouth daily.  . isosorbide mononitrate (IMDUR) 30 MG 24 hr tablet Take 1 tablet (30 mg total) by mouth daily.  . nitroGLYCERIN (NITROSTAT) 0.3 MG SL tablet Place 1 tablet (0.3 mg total) under the tongue every 5 (five) minutes as needed for chest pain.  . ramipril (ALTACE) 5 MG capsule Take 1 capsule (5 mg total) by mouth daily.  Marland Kitchen spironolactone (ALDACTONE) 25 MG tablet Take 0.5 tablets (12.5 mg total) by mouth 2 (two) times daily.     Allergies:   Patient has no known allergies.   Social History   Socioeconomic History  . Marital status: Married    Spouse name: Not on file  . Number of children: Not on file  . Years of education: Not on file  . Highest education level: Not on file  Occupational History  . Not on file  Tobacco Use  . Smoking status: Former Research scientist (life sciences)  . Smokeless tobacco: Never Used  Substance and Sexual Activity  . Alcohol use: Yes    Comment: occ  . Drug use: No  . Sexual activity: Not on file  Other Topics Concern  . Not on file  Social History Narrative  . Not on file   Social Determinants of Health  Financial Resource Strain:   . Difficulty of Paying Living Expenses: Not on file  Food Insecurity:   . Worried About Programme researcher, broadcasting/film/video in the Last Year: Not on file  . Ran Out of Food in the Last Year: Not on file  Transportation Needs:   . Lack of Transportation (Medical): Not on file  . Lack of Transportation (Non-Medical): Not on file  Physical Activity:   . Days of Exercise per Week: Not on file  . Minutes of Exercise per Session: Not on file  Stress:   . Feeling of Stress : Not on file  Social Connections:   . Frequency of Communication with Friends and Family: Not on file  . Frequency of Social Gatherings with Friends and Family: Not on file  . Attends Religious  Services: Not on file  . Active Member of Clubs or Organizations: Not on file  . Attends Banker Meetings: Not on file  . Marital Status: Not on file     Family History: The patient's family history includes Heart disease in his father. ROS:   Please see the history of present illness.    All 14 point review of systems negative except as described per history of present illness  EKGs/Labs/Other Studies Reviewed:    EKG shows sinus bradycardia rate of 58.  There is a QS in V1 to V3.  Nonspecific ST segment changes with T inversion in lead III and V6 as well as V5.  Recent Labs: No results found for requested labs within last 8760 hours.  Recent Lipid Panel    Component Value Date/Time   CHOL 130 03/12/2019 0843   TRIG 74 03/12/2019 0843   HDL 54 03/12/2019 0843   CHOLHDL 2.4 03/12/2019 0843   CHOLHDL 3.0 02/07/2017 0236   VLDL 15 02/07/2017 0236   LDLCALC 61 03/12/2019 0843    Physical Exam:    VS:  BP 132/70   Pulse 60   Ht 5\' 9"  (1.753 m)   Wt 186 lb (84.4 kg)   SpO2 97%   BMI 27.47 kg/m     Wt Readings from Last 3 Encounters:  09/21/19 186 lb (84.4 kg)  03/06/19 182 lb 6.4 oz (82.7 kg)  09/22/18 190 lb 12.8 oz (86.5 kg)     GEN:  Well nourished, well developed in no acute distress HEENT: Normal NECK: No JVD; No carotid bruits LYMPHATICS: No lymphadenopathy CARDIAC: RRR, no murmurs, no rubs, no gallops RESPIRATORY:  Clear to auscultation without rales, wheezing or rhonchi  ABDOMEN: Soft, non-tender, non-distended MUSCULOSKELETAL:  No edema; No deformity  SKIN: Warm and dry LOWER EXTREMITIES: no swelling NEUROLOGIC:  Alert and oriented x 3 PSYCHIATRIC:  Normal affect   ASSESSMENT:    1. Dyslipidemia (high LDL; low HDL)   2. Coronary artery disease involving native coronary artery of native heart without angina pectoris   3. Essential hypertension   4. Hyperlipidemia LDL goal <70    PLAN:    In order of problems listed  above:  1. Coronary artery disease doing well from that point review asymptomatic on appropriate medications which I will continue. 2. Dyslipidemia.  We will schedule him to have fasting lipid profile.  Last fasting lipid profile was excellent. 3. Cardiomyopathy ejection fraction 40 to 45%.  He is on spironolactone and Altace as well as Coreg.  He does not want to try Entresto.  Overall doing well we will continue present medications.   Medication Adjustments/Labs and Tests Ordered: Current medicines  are reviewed at length with the patient today.  Concerns regarding medicines are outlined above.  Orders Placed This Encounter  Procedures  . Lipid panel   Medication changes: No orders of the defined types were placed in this encounter.   Signed, Georgeanna Lea, MD, [facility] 09/21/2019 10:05 AM    [facility]

## 2019-09-22 LAB — BASIC METABOLIC PANEL
BUN/Creatinine Ratio: 13 (ref 10–24)
BUN: 13 mg/dL (ref 8–27)
CO2: 23 mmol/L (ref 20–29)
Calcium: 9.5 mg/dL (ref 8.6–10.2)
Chloride: 99 mmol/L (ref 96–106)
Creatinine, Ser: 1.04 mg/dL (ref 0.76–1.27)
GFR calc Af Amer: 83 mL/min/{1.73_m2} (ref >59)
GFR calc non Af Amer: 72 mL/min/{1.73_m2} (ref >59)
Glucose: 107 mg/dL — ABNORMAL HIGH (ref 65–99)
Potassium: 4.5 mmol/L (ref 3.5–5.2)
Sodium: 135 mmol/L (ref 134–144)

## 2019-09-22 LAB — LIPID PANEL
Chol/HDL Ratio: 2.1 ratio (ref 0.0–5.0)
Cholesterol, Total: 128 mg/dL (ref 100–199)
HDL: 61 mg/dL (ref >39)
LDL Chol Calc (NIH): 51 mg/dL (ref 0–99)
Triglycerides: 83 mg/dL (ref 0–149)
VLDL Cholesterol Cal: 16 mg/dL (ref 5–40)

## 2019-11-22 ENCOUNTER — Other Ambulatory Visit: Payer: Self-pay | Admitting: Cardiology

## 2020-02-21 ENCOUNTER — Other Ambulatory Visit: Payer: Self-pay | Admitting: Cardiology

## 2020-03-07 ENCOUNTER — Other Ambulatory Visit: Payer: Self-pay | Admitting: Cardiology

## 2020-03-07 DIAGNOSIS — I2510 Atherosclerotic heart disease of native coronary artery without angina pectoris: Secondary | ICD-10-CM

## 2020-03-07 DIAGNOSIS — I10 Essential (primary) hypertension: Secondary | ICD-10-CM

## 2020-03-14 ENCOUNTER — Other Ambulatory Visit: Payer: Self-pay | Admitting: Cardiology

## 2020-03-14 DIAGNOSIS — I10 Essential (primary) hypertension: Secondary | ICD-10-CM

## 2020-03-14 DIAGNOSIS — I2510 Atherosclerotic heart disease of native coronary artery without angina pectoris: Secondary | ICD-10-CM

## 2020-03-28 ENCOUNTER — Other Ambulatory Visit: Payer: Self-pay | Admitting: Cardiology

## 2020-03-28 DIAGNOSIS — I10 Essential (primary) hypertension: Secondary | ICD-10-CM

## 2020-03-28 DIAGNOSIS — I2510 Atherosclerotic heart disease of native coronary artery without angina pectoris: Secondary | ICD-10-CM

## 2020-03-31 ENCOUNTER — Ambulatory Visit: Payer: Medicare Other | Admitting: Cardiology

## 2020-05-16 ENCOUNTER — Ambulatory Visit: Payer: Medicare Other | Admitting: Cardiology

## 2020-07-15 ENCOUNTER — Other Ambulatory Visit: Payer: Self-pay

## 2020-07-15 DIAGNOSIS — I2510 Atherosclerotic heart disease of native coronary artery without angina pectoris: Secondary | ICD-10-CM | POA: Insufficient documentation

## 2020-07-15 DIAGNOSIS — E7800 Pure hypercholesterolemia, unspecified: Secondary | ICD-10-CM | POA: Insufficient documentation

## 2020-07-15 DIAGNOSIS — I10 Essential (primary) hypertension: Secondary | ICD-10-CM | POA: Insufficient documentation

## 2020-07-18 ENCOUNTER — Other Ambulatory Visit: Payer: Self-pay

## 2020-07-18 ENCOUNTER — Ambulatory Visit: Payer: Medicare Other | Admitting: Cardiology

## 2020-07-18 ENCOUNTER — Encounter: Payer: Self-pay | Admitting: Cardiology

## 2020-07-18 VITALS — BP 120/76 | HR 58 | Ht 69.0 in | Wt 184.0 lb

## 2020-07-18 DIAGNOSIS — E785 Hyperlipidemia, unspecified: Secondary | ICD-10-CM

## 2020-07-18 DIAGNOSIS — I255 Ischemic cardiomyopathy: Secondary | ICD-10-CM | POA: Diagnosis not present

## 2020-07-18 DIAGNOSIS — I10 Essential (primary) hypertension: Secondary | ICD-10-CM | POA: Diagnosis not present

## 2020-07-18 DIAGNOSIS — I2510 Atherosclerotic heart disease of native coronary artery without angina pectoris: Secondary | ICD-10-CM | POA: Diagnosis not present

## 2020-07-18 NOTE — Progress Notes (Signed)
Cardiology Office Note:    Date:  07/18/2020   ID:  Joseph Guerra, DOB 08/17/1946, MRN [id number]  PCP:  Andreas Blower., MD  Cardiologist:  Gypsy Balsam, MD    Referring MD: Andreas Blower., MD   Chief Complaint  Patient presents with  . Follow-up  Doing well  History of Present Illness:    Joseph Guerra is a 74 y.o. male  with past medical history significant for coronary artery disease, status post myocardial infarction 2001 as well as myocardial infarction in June 2018 that required stenting to LAD as well as multiple stent to right coronary artery.  Does have also history of cardiomyopathy with ejection fraction initially 30% and now latest estimation the summer 2020 of 40 to 45%. Comes today to my office for follow-up will doing well.  Denies any chest pain tightness squeezing pressure burning chest.  He try to walk on the regular basis have no difficulty doing it.  Does have only some shortness of breath.  Past Medical History:  Diagnosis Date  . Arthritis associated with another disorder 12/17/2011  . Atherosclerotic heart disease of native coronary artery without angina pectoris 12/17/2011   Formatting of this note might be different from the original. Myocardial infarction 2002 with apical hypokinesis Formatting of this note might be different from the original. Managed by Dr. Bing Matter  . CAD (coronary artery disease)    a. prior MI in 2001 with stents placed at that time (patient unsure of which arteries)  . Chronic dermatitis 10/24/2015  . Coronary artery disease 12/17/2011   Overview:  Myocardial infarction 2002 with apical hypokinesis  . Dyslipidemia (high LDL; low HDL) 06/18/2016  . Essential hypertension 10/24/2015  . Hypercholesteremia   . Hyperlipidemia LDL goal <70   . Hypertension   . Hypokalemia   . Ischemic cardiomyopathy    Ejection fraction 30% in June 2018  . Knee pain, left 05/07/2016  . PVC's (premature ventricular contractions) 03/28/2017  .  ST elevation myocardial infarction (STEMI) (HCC)   . ST elevation myocardial infarction (STEMI) of anterolateral wall (HCC) 02/06/2017    Past Surgical History:  Procedure Laterality Date  . CORONARY ANGIOPLASTY WITH STENT PLACEMENT    . CORONARY BALLOON ANGIOPLASTY N/A 02/06/2017   Procedure: Coronary Balloon Angioplasty;  Surgeon: Iran Ouch, MD;  Location: [facility];  Service: Cardiovascular;  Laterality: N/A;  . LEFT HEART CATH AND CORONARY ANGIOGRAPHY N/A 02/06/2017   Procedure: Left Heart Cath and Coronary Angiography;  Surgeon: Iran Ouch, MD;  Location: [facility] INVASIVE CV LAB;  Service: Cardiovascular;  Laterality: N/A;    Current Medications: Current Meds  Medication Sig  . aspirin 81 MG chewable tablet Chew 1 tablet (81 mg total) by mouth daily.  Marland Kitchen atorvastatin (LIPITOR) 80 MG tablet Take 1 tablet by mouth at bedtime.  . carvedilol (COREG) 12.5 MG tablet TAKE 1 TABLET BY MOUTH TWICE DAILY WITH MEALS  . clobetasol cream (TEMOVATE) 0.05 % Apply 60 g topically 2 (two) times daily.  . folic acid (FOLVITE) 400 MCG tablet Take 400 mcg by mouth daily.  . isosorbide mononitrate (IMDUR) 30 MG 24 hr tablet Take 1 tablet by mouth once daily  . nitroGLYCERIN (NITROSTAT) 0.3 MG SL tablet Place 1 tablet (0.3 mg total) under the tongue every 5 (five) minutes as needed for chest pain.  . ramipril (ALTACE) 5 MG capsule Take 1 capsule by mouth once daily  . spironolactone (ALDACTONE) 25 MG tablet Take 1/2 (one-half) tablet by mouth  twice daily     Allergies:   Patient has no known allergies.   Social History   Socioeconomic History  . Marital status: Married    Spouse name: Not on file  . Number of children: Not on file  . Years of education: Not on file  . Highest education level: Not on file  Occupational History  . Not on file  Tobacco Use  . Smoking status: Former Games developer  . Smokeless tobacco: Never Used  Vaping Use  . Vaping Use: Never used  Substance and  Sexual Activity  . Alcohol use: Yes    Comment: occ  . Drug use: No  . Sexual activity: Not on file  Other Topics Concern  . Not on file  Social History Narrative  . Not on file   Social Determinants of Health   Financial Resource Strain:   . Difficulty of Paying Living Expenses: Not on file  Food Insecurity:   . Worried About Programme researcher, broadcasting/film/video in the Last Year: Not on file  . Ran Out of Food in the Last Year: Not on file  Transportation Needs:   . Lack of Transportation (Medical): Not on file  . Lack of Transportation (Non-Medical): Not on file  Physical Activity:   . Days of Exercise per Week: Not on file  . Minutes of Exercise per Session: Not on file  Stress:   . Feeling of Stress : Not on file  Social Connections:   . Frequency of Communication with Friends and Family: Not on file  . Frequency of Social Gatherings with Friends and Family: Not on file  . Attends Religious Services: Not on file  . Active Member of Clubs or Organizations: Not on file  . Attends Banker Meetings: Not on file  . Marital Status: Not on file     Family History: The patient's family history includes Heart disease in his father. ROS:   Please see the history of present illness.    All 14 point review of systems negative except as described per history of present illness  EKGs/Labs/Other Studies Reviewed:      Recent Labs: 09/21/2019: BUN 13; Creatinine, Ser 1.04; Potassium 4.5; Sodium 135  Recent Lipid Panel    Component Value Date/Time   CHOL 128 09/21/2019 1106   TRIG 83 09/21/2019 1106   HDL 61 09/21/2019 1106   CHOLHDL 2.1 09/21/2019 1106   CHOLHDL 3.0 02/07/2017 0236   VLDL 15 02/07/2017 0236   LDLCALC 51 09/21/2019 1106    Physical Exam:    VS:  BP 120/76 (BP Location: Right Arm, Patient Position: Sitting, Cuff Size: Normal)   Pulse (!) 58   Ht 5\' 9"  (1.753 m)   Wt 184 lb (83.5 kg)   SpO2 91%   BMI 27.17 kg/m     Wt Readings from Last 3 Encounters:    07/18/20 184 lb (83.5 kg)  09/21/19 186 lb (84.4 kg)  03/06/19 182 lb 6.4 oz (82.7 kg)     GEN:  Well nourished, well developed in no acute distress HEENT: Normal NECK: No JVD; No carotid bruits LYMPHATICS: No lymphadenopathy CARDIAC: RRR, no murmurs, no rubs, no gallops RESPIRATORY:  Clear to auscultation without rales, wheezing or rhonchi  ABDOMEN: Soft, non-tender, non-distended MUSCULOSKELETAL:  No edema; No deformity  SKIN: Warm and dry LOWER EXTREMITIES: no swelling NEUROLOGIC:  Alert and oriented x 3 PSYCHIATRIC:  Normal affect   ASSESSMENT:    1. Coronary artery disease involving native coronary  artery of native heart without angina pectoris   2. Dyslipidemia (high LDL; low HDL)   3. Primary hypertension   4. Ischemic cardiomyopathy    PLAN:    In order of problems listed above:  1. Coronary disease status post myocardial infarction 2001 as well as 2020, ejection fraction 40 to 45% on ARB, beta-blocker, Aldactone which I will continue.  Echocardiogram will be repeated to recheck left ventricle ejection fraction. 2. Dyslipidemia I did review his fasting lipid profile done by primary care physician LDL is 72 and HDL 46 we will continue present management. 3. Essential hypertension blood pressure well controlled continue present management. 4. Ischemic cardiomyopathy on appropriate guideline directed medical therapy, will recheck ejection fraction.   Medication Adjustments/Labs and Tests Ordered: Current medicines are reviewed at length with the patient today.  Concerns regarding medicines are outlined above.  No orders of the defined types were placed in this encounter.  Medication changes: No orders of the defined types were placed in this encounter.   Signed, Georgeanna Lea, MD, [facility] 07/18/2020 4:23 PM    [facility]

## 2020-07-18 NOTE — Patient Instructions (Signed)
Medication Instructions:  °Your physician recommends that you continue on your current medications as directed. Please refer to the Current Medication list given to you today. ° °*If you need a refill on your cardiac medications before your next appointment, please call your pharmacy* ° ° °Lab Work: °None. ° °If you have labs (blood work) drawn today and your tests are completely normal, you will receive your results only by: °• MyChart Message (if you have MyChart) OR °• A paper copy in the mail °If you have any lab test that is abnormal or we need to change your treatment, we will call you to review the results. ° ° °Testing/Procedures: °Your physician has requested that you have an echocardiogram. Echocardiography is a painless test that uses sound waves to create images of your heart. It provides your doctor with information about the size and shape of your heart and how well your heart’s chambers and valves are working. This procedure takes approximately one hour. There are no restrictions for this procedure. ° ° ° ° °Follow-Up: °At [facility], you and your health needs are our priority.  As part of our continuing mission to provide you with exceptional heart care, we have created designated Provider Care Teams.  These Care Teams include your primary Cardiologist (physician) and Advanced Practice Providers (APPs -  Physician Assistants and Nurse Practitioners) who all work together to provide you with the care you need, when you need it. ° °We recommend signing up for the patient portal called "MyChart".  Sign up information is provided on this After Visit Summary.  MyChart is used to connect with patients for Virtual Visits (Telemedicine).  Patients are able to view lab/test results, encounter notes, upcoming appointments, etc.  Non-urgent messages can be sent to your provider as well.   °To learn more about what you can do with MyChart, go to [email or web address].   ° °Your next appointment:   °6  month(s) ° °The format for your next appointment:   °In Person ° °Provider:   °Robert Krasowski, MD ° ° °Other Instructions ° ° °Echocardiogram °An echocardiogram is a procedure that uses painless sound waves (ultrasound) to produce an image of the heart. Images from an echocardiogram can provide important information about: °· Signs of coronary artery disease (CAD). °· Aneurysm detection. An aneurysm is a weak or damaged part of an artery wall that bulges out from the normal force of blood pumping through the body. °· Heart size and shape. Changes in the size or shape of the heart can be associated with certain conditions, including heart failure, aneurysm, and CAD. °· Heart muscle function. °· Heart valve function. °· Signs of a past heart attack. °· Fluid buildup around the heart. °· Thickening of the heart muscle. °· A tumor or infectious growth around the heart valves. °Tell a health care provider about: °· Any allergies you have. °· All medicines you are taking, including vitamins, herbs, eye drops, creams, and over-the-counter medicines. °· Any blood disorders you have. °· Any surgeries you have had. °· Any medical conditions you have. °· Whether you are pregnant or may be pregnant. °What are the risks? °Generally, this is a safe procedure. However, problems may occur, including: °· Allergic reaction to dye (contrast) that may be used during the procedure. °What happens before the procedure? °No specific preparation is needed. You may eat and drink normally. °What happens during the procedure? ° °· An IV tube may be inserted into one of your veins. °· You may   receive contrast through this tube. A contrast is an injection that improves the quality of the pictures from your heart. °· A gel will be applied to your chest. °· A wand-like tool (transducer) will be moved over your chest. The gel will help to transmit the sound waves from the transducer. °· The sound waves will harmlessly bounce off of your heart to  allow the heart images to be captured in real-time motion. The images will be recorded on a computer. °The procedure may vary among health care providers and hospitals. °What happens after the procedure? °· You may return to your normal, everyday life, including diet, activities, and medicines, unless your health care provider tells you not to do that. °Summary °· An echocardiogram is a procedure that uses painless sound waves (ultrasound) to produce an image of the heart. °· Images from an echocardiogram can provide important information about the size and shape of your heart, heart muscle function, heart valve function, and fluid buildup around your heart. °· You do not need to do anything to prepare before this procedure. You may eat and drink normally. °· After the echocardiogram is completed, you may return to your normal, everyday life, unless your health care provider tells you not to do that. °This information is not intended to replace advice given to you by your health care provider. Make sure you discuss any questions you have with your health care provider. °Document Revised: 12/04/2018 Document Reviewed: 09/15/2016 °Elsevier Patient Education © 2020 Elsevier Inc. ° ° °

## 2020-08-10 ENCOUNTER — Ambulatory Visit (HOSPITAL_BASED_OUTPATIENT_CLINIC_OR_DEPARTMENT_OTHER)
Admission: RE | Admit: 2020-08-10 | Discharge: 2020-08-10 | Disposition: A | Discharge status: Home / Self Care | Payer: Medicare Other | Source: Ambulatory Visit | Attending: Cardiology | Admitting: Cardiology

## 2020-08-10 ENCOUNTER — Other Ambulatory Visit: Payer: Self-pay

## 2020-08-10 DIAGNOSIS — I255 Ischemic cardiomyopathy: Secondary | ICD-10-CM | POA: Diagnosis not present

## 2020-08-10 DIAGNOSIS — I2510 Atherosclerotic heart disease of native coronary artery without angina pectoris: Secondary | ICD-10-CM | POA: Diagnosis not present

## 2020-08-10 DIAGNOSIS — E785 Hyperlipidemia, unspecified: Secondary | ICD-10-CM

## 2020-08-10 DIAGNOSIS — I10 Essential (primary) hypertension: Secondary | ICD-10-CM

## 2020-08-10 LAB — ECHOCARDIOGRAM COMPLETE
Area-P 1/2: 2.84 cm2
S' Lateral: 4.52 cm

## 2020-08-12 ENCOUNTER — Telehealth: Payer: Self-pay | Admitting: Cardiology

## 2020-08-12 NOTE — Telephone Encounter (Signed)
Called patient daughter informed her of results per dpr.  

## 2020-08-12 NOTE — Telephone Encounter (Signed)
Follow up:     Patient daughter returning a call back for results. 

## 2020-08-17 ENCOUNTER — Other Ambulatory Visit: Payer: Self-pay | Admitting: Cardiology

## 2020-08-31 ENCOUNTER — Other Ambulatory Visit: Payer: Self-pay | Admitting: Cardiology

## 2020-10-12 ENCOUNTER — Other Ambulatory Visit: Payer: Self-pay | Admitting: Cardiology

## 2020-10-12 DIAGNOSIS — I10 Essential (primary) hypertension: Secondary | ICD-10-CM

## 2020-10-12 DIAGNOSIS — I2510 Atherosclerotic heart disease of native coronary artery without angina pectoris: Secondary | ICD-10-CM

## 2020-10-12 NOTE — Telephone Encounter (Signed)
Rx refill sent to pharmacy. 

## 2020-12-05 ENCOUNTER — Other Ambulatory Visit: Payer: Self-pay | Admitting: Cardiology

## 2020-12-05 DIAGNOSIS — I10 Essential (primary) hypertension: Secondary | ICD-10-CM

## 2020-12-05 DIAGNOSIS — I2510 Atherosclerotic heart disease of native coronary artery without angina pectoris: Secondary | ICD-10-CM

## 2020-12-06 NOTE — Telephone Encounter (Signed)
Ramipril and Imdur approved and sent

## 2021-01-04 ENCOUNTER — Other Ambulatory Visit: Payer: Self-pay | Admitting: Cardiology

## 2021-01-04 DIAGNOSIS — I2510 Atherosclerotic heart disease of native coronary artery without angina pectoris: Secondary | ICD-10-CM

## 2021-01-04 DIAGNOSIS — I10 Essential (primary) hypertension: Secondary | ICD-10-CM

## 2021-01-17 ENCOUNTER — Other Ambulatory Visit: Payer: Self-pay

## 2021-01-18 ENCOUNTER — Other Ambulatory Visit: Payer: Self-pay

## 2021-01-18 ENCOUNTER — Ambulatory Visit: Payer: Medicare Other | Admitting: Cardiology

## 2021-01-18 ENCOUNTER — Encounter: Payer: Self-pay | Admitting: Cardiology

## 2021-01-18 VITALS — BP 112/60 | HR 49 | Ht 69.0 in | Wt 177.0 lb

## 2021-01-18 DIAGNOSIS — I255 Ischemic cardiomyopathy: Secondary | ICD-10-CM

## 2021-01-18 DIAGNOSIS — I493 Ventricular premature depolarization: Secondary | ICD-10-CM

## 2021-01-18 DIAGNOSIS — I2510 Atherosclerotic heart disease of native coronary artery without angina pectoris: Secondary | ICD-10-CM

## 2021-01-18 DIAGNOSIS — E785 Hyperlipidemia, unspecified: Secondary | ICD-10-CM

## 2021-01-18 DIAGNOSIS — I10 Essential (primary) hypertension: Secondary | ICD-10-CM | POA: Diagnosis not present

## 2021-01-18 NOTE — Patient Instructions (Signed)
Medication Instructions:  Your physician recommends that you continue on your current medications as directed. Please refer to the Current Medication list given to you today.  *If you need a refill on your cardiac medications before your next appointment, please call your pharmacy*   Lab Work: Your physician recommends that you return for lab work in: Hepatic Fuction Panel If you have labs (blood work) drawn today and your tests are completely normal, you will receive your results only by: Marland Kitchen MyChart Message (if you have MyChart) OR . A paper copy in the mail If you have any lab test that is abnormal or we need to change your treatment, we will call you to review the results.   Testing/Procedures: None   Follow-Up: At [facility], you and your health needs are our priority.  As part of our continuing mission to provide you with exceptional heart care, we have created designated Provider Care Teams.  These Care Teams include your primary Cardiologist (physician) and Advanced Practice Providers (APPs -  Physician Assistants and Nurse Practitioners) who all work together to provide you with the care you need, when you need it.  We recommend signing up for the patient portal called "MyChart".  Sign up information is provided on this After Visit Summary.  MyChart is used to connect with patients for Virtual Visits (Telemedicine).  Patients are able to view lab/test results, encounter notes, upcoming appointments, etc.  Non-urgent messages can be sent to your provider as well.   To learn more about what you can do with MyChart, go to [email or web address].    Your next appointment:   6 month(s)  The format for your next appointment:   In Person  Provider:   Gypsy Balsam, MD   Other Instructions

## 2021-01-18 NOTE — Progress Notes (Signed)
Cardiology Office Note:    Date:  01/18/2021   ID:  Joseph Guerra, DOB [birth year], MRN [id number]  PCP:  Andreas Blower., MD  Cardiologist:  Gypsy Balsam, MD    Referring MD: Andreas Blower., MD   Chief Complaint  Patient presents with  . Follow-up  And doing well  History of Present Illness:    Joseph Guerra is a 75 y.o. male with past medical history significant for coronary artery disease, status post myocardial infarction 2001 as well as microinfarction June 2018 that required stenting to LAD as well as multiple stent to right coronary artery.  He does have history of cardiomyopathy with ejection fraction 30% however latest estimation of 45%.  He comes today 2 months of follow-up overall doing very well.  Denies have any chest pain tightness squeezing pressure burning chest.  Still likes to work in the garden have no difficulty doing it.  Past Medical History:  Diagnosis Date  . Arthritis associated with another disorder 12/17/2011  . Atherosclerotic heart disease of native coronary artery without angina pectoris 12/17/2011   Formatting of this note might be different from the original. Myocardial infarction 2002 with apical hypokinesis Formatting of this note might be different from the original. Managed by Dr. Bing Matter  . CAD (coronary artery disease)    a. prior MI in 2001 with stents placed at that time (patient unsure of which arteries)  . Chronic dermatitis 10/24/2015  . Coronary artery disease 12/17/2011   Overview:  Myocardial infarction 2002 with apical hypokinesis  . Dyslipidemia (high LDL; low HDL) 06/18/2016  . Essential hypertension 10/24/2015  . Hypercholesteremia   . Hyperlipidemia LDL goal <70   . Hypertension   . Hypokalemia   . Ischemic cardiomyopathy    Ejection fraction 30% in June 2018  . Knee pain, left 05/07/2016  . PVC's (premature ventricular contractions) 03/28/2017  . ST elevation myocardial infarction (STEMI) (HCC)   . ST elevation  myocardial infarction (STEMI) of anterolateral wall (HCC) 02/06/2017    Past Surgical History:  Procedure Laterality Date  . CORONARY ANGIOPLASTY WITH STENT PLACEMENT    . CORONARY BALLOON ANGIOPLASTY N/A 02/06/2017   Procedure: Coronary Balloon Angioplasty;  Surgeon: Iran Ouch, MD;  Location: [facility] INVASIVE CV LAB;  Service: Cardiovascular;  Laterality: N/A;  . LEFT HEART CATH AND CORONARY ANGIOGRAPHY N/A 02/06/2017   Procedure: Left Heart Cath and Coronary Angiography;  Surgeon: Iran Ouch, MD;  Location: [facility] INVASIVE CV LAB;  Service: Cardiovascular;  Laterality: N/A;    Current Medications: Current Meds  Medication Sig  . aspirin 81 MG chewable tablet Chew 1 tablet (81 mg total) by mouth daily.  Marland Kitchen atorvastatin (LIPITOR) 80 MG tablet TAKE 1 TABLET(80 MG) BY MOUTH DAILY (Patient taking differently: Take 80 mg by mouth daily.)  . carvedilol (COREG) 12.5 MG tablet TAKE 1 TABLET BY MOUTH TWICE DAILY WITH MEALS (Patient taking differently: Take 12.5 mg by mouth 2 (two) times daily with a meal.)  . folic acid (FOLVITE) 400 MCG tablet Take 400 mcg by mouth daily.  . isosorbide mononitrate (IMDUR) 30 MG 24 hr tablet Take 1 tablet by mouth once daily (Patient taking differently: Take 30 mg by mouth daily.)  . ramipril (ALTACE) 5 MG capsule Take 1 capsule by mouth once daily (Patient taking differently: Take 5 mg by mouth daily.)  . spironolactone (ALDACTONE) 25 MG tablet Take 1/2 (one-half) tablet by mouth twice daily (Patient taking differently: Take 12.5 mg by mouth 2 (two) times daily.)  .  ticagrelor (BRILINTA) 90 MG TABS tablet Take 90 mg by mouth 2 (two) times daily.     Allergies:   Patient has no known allergies.   Social History   Socioeconomic History  . Marital status: Married    Spouse name: Not on file  . Number of children: Not on file  . Years of education: Not on file  . Highest education level: Not on file  Occupational History  . Not on file  Tobacco Use  .  Smoking status: Former Games developer  . Smokeless tobacco: Never Used  Vaping Use  . Vaping Use: Never used  Substance and Sexual Activity  . Alcohol use: Yes    Comment: occ  . Drug use: No  . Sexual activity: Not on file  Other Topics Concern  . Not on file  Social History Narrative  . Not on file   Social Determinants of Health   Financial Resource Strain: Not on file  Food Insecurity: Not on file  Transportation Needs: Not on file  Physical Activity: Not on file  Stress: Not on file  Social Connections: Not on file     Family History: The patient's family history includes Heart disease in his father. ROS:   Please see the history of present illness.    All 14 point review of systems negative except as described per history of present illness  EKGs/Labs/Other Studies Reviewed:      Recent Labs: No results found for requested labs within last 8760 hours.  Recent Lipid Panel    Component Value Date/Time   CHOL 128 09/21/2019 1106   TRIG 83 09/21/2019 1106   HDL 61 09/21/2019 1106   CHOLHDL 2.1 09/21/2019 1106   CHOLHDL 3.0 02/07/2017 0236   VLDL 15 02/07/2017 0236   LDLCALC 51 09/21/2019 1106    Physical Exam:    VS:  BP 112/60 (BP Location: Right Arm, Patient Position: Sitting)   Pulse (!) 49   Ht 5\' 9"  (1.753 m)   Wt 177 lb (80.3 kg)   SpO2 95%   BMI 26.14 kg/m     Wt Readings from Last 3 Encounters:  01/18/21 177 lb (80.3 kg)  07/18/20 184 lb (83.5 kg)  09/21/19 186 lb (84.4 kg)     GEN:  Well nourished, well developed in no acute distress HEENT: Normal NECK: No JVD; No carotid bruits LYMPHATICS: No lymphadenopathy CARDIAC: RRR, no murmurs, no rubs, no gallops RESPIRATORY:  Clear to auscultation without rales, wheezing or rhonchi  ABDOMEN: Soft, non-tender, non-distended MUSCULOSKELETAL:  No edema; No deformity  SKIN: Warm and dry LOWER EXTREMITIES: no swelling NEUROLOGIC:  Alert and oriented x 3 PSYCHIATRIC:  Normal affect   ASSESSMENT:     1. Coronary artery disease involving native coronary artery of native heart without angina pectoris   2. Primary hypertension   3. PVC's (premature ventricular contractions)   4. Ischemic cardiomyopathy   5. Hyperlipidemia LDL goal <70    PLAN:    In order of problems listed above:  1. Coronary disease stable from that point review and appropriate medication which I will continue.  He is on antiplatelet therapy.  His ejection fraction based on last echocardiogram improved. 2. Essential hypertension blood pressure well controlled continue present management. 3. Bradycardia which is sinus but he is absolutely completely asymptomatic, we will continue present management. 4. Dyslipidemia he is taking statin high intensity which I will continue.  I did review K PN from last year which show LDL of 51  HDL 61.  We will repeat fasting lipid profile.   Medication Adjustments/Labs and Tests Ordered: Current medicines are reviewed at length with the patient today.  Concerns regarding medicines are outlined above.  No orders of the defined types were placed in this encounter.  Medication changes: No orders of the defined types were placed in this encounter.   Signed, Georgeanna Lea, MD, [facility] 01/18/2021 12:07 PM    [facility]

## 2021-01-20 LAB — LIPID PANEL
Chol/HDL Ratio: 2.5 ratio (ref 0.0–5.0)
Cholesterol, Total: 141 mg/dL (ref 100–199)
HDL: 56 mg/dL (ref >39)
LDL Chol Calc (NIH): 70 mg/dL (ref 0–99)
Triglycerides: 78 mg/dL (ref 0–149)
VLDL Cholesterol Cal: 15 mg/dL (ref 5–40)

## 2021-01-20 LAB — HEPATIC FUNCTION PANEL
ALT: 32 IU/L (ref 0–44)
AST: 29 IU/L (ref 0–40)
Albumin: 4.8 g/dL — ABNORMAL HIGH (ref 3.7–4.7)
Alkaline Phosphatase: 73 IU/L (ref 44–121)
Bilirubin Total: 0.7 mg/dL (ref 0.0–1.2)
Bilirubin, Direct: 0.19 mg/dL (ref 0.00–0.40)
Total Protein: 7.1 g/dL (ref 6.0–8.5)

## 2021-01-24 ENCOUNTER — Telehealth: Payer: Self-pay

## 2021-01-24 NOTE — Telephone Encounter (Signed)
Spoke with patient wife(on DPR), notified of test results

## 2021-01-24 NOTE — Telephone Encounter (Signed)
-----   Message from Georgeanna Lea, MD sent at 01/20/2021  8:59 AM EDT ----- Cholesterol good, liver function test good

## 2021-03-06 ENCOUNTER — Other Ambulatory Visit: Payer: Self-pay | Admitting: Cardiology

## 2021-03-06 DIAGNOSIS — I10 Essential (primary) hypertension: Secondary | ICD-10-CM

## 2021-03-06 DIAGNOSIS — I2510 Atherosclerotic heart disease of native coronary artery without angina pectoris: Secondary | ICD-10-CM

## 2021-03-15 ENCOUNTER — Other Ambulatory Visit: Payer: Self-pay | Admitting: Cardiology

## 2021-03-15 DIAGNOSIS — I2510 Atherosclerotic heart disease of native coronary artery without angina pectoris: Secondary | ICD-10-CM

## 2021-03-15 DIAGNOSIS — I10 Essential (primary) hypertension: Secondary | ICD-10-CM

## 2021-06-12 ENCOUNTER — Other Ambulatory Visit: Payer: Self-pay | Admitting: Cardiology

## 2021-06-14 ENCOUNTER — Other Ambulatory Visit: Payer: Self-pay | Admitting: Cardiology

## 2021-06-23 ENCOUNTER — Telehealth: Payer: Self-pay

## 2021-06-23 NOTE — Telephone Encounter (Signed)
    Patient Name: Joseph Guerra  DOB: Mar 27, 1946 MRN: [id number]  Primary Cardiologist: Gypsy Balsam, MD  Chart reviewed as part of pre-operative protocol coverage.   Patient has a history of CAD s/p PCI/DES to LAD in 2018. He has remained on DAPT with aspirin and brilinta since that time. Doing well at his last visit with Dr. Bing Matter 12/2020.  Dr. Bing Matter - any objections to holding brilinta 5 days prior to his upcoming EGD/colonoscopy? Please route your response back to P CV DIV PREOP.  Thanks!  Beatriz Stallion, PA-C 06/23/2021, 1:27 PM

## 2021-06-23 NOTE — Telephone Encounter (Signed)
   [facility] Pre-operative Risk Assessment    Request for surgical clearance:  What type of surgery is being performed? Colonoscopy/EGD  When is this surgery scheduled? TBD   What type of clearance is required (medical clearance vs. Pharmacy clearance to hold med vs. Both)? Pharmacy  Are there any medications that need to be held prior to surgery and how long?Brillinta stop 5 days prior to procedure date    Practice name and name of physician performing surgery? Dr. Shana Chute at [facility]   What is your office phone number: [phone]    7.   What is your office fax number: [phone]  8.   Anesthesia type (None, local, MAC, general) ? Not specified    Basil Dess Maxamillian Tienda 06/23/2021, 11:30 AM  _________________________________________________________________   (provider comments below)

## 2021-06-26 NOTE — Telephone Encounter (Signed)
    Patient Name: Joseph Guerra  DOB: February 04, 1946 MRN: [id number]  Primary Cardiologist: Gypsy Balsam, MD  Chart reviewed as part of pre-operative protocol coverage.   Per Dr. Bing Matter, patient Brilinta 5 days prior to his upcoming colonoscopy with plans to restart as soon as he is cleared to do so by his gastroenterologist.  I will route this recommendation to the requesting party via Epic fax function and remove from pre-op pool.  Please call with questions.  Beatriz Stallion, PA-C 06/26/2021, 5:40 PM

## 2021-07-09 ENCOUNTER — Other Ambulatory Visit: Payer: Self-pay | Admitting: Cardiology

## 2021-07-09 DIAGNOSIS — I2510 Atherosclerotic heart disease of native coronary artery without angina pectoris: Secondary | ICD-10-CM

## 2021-07-09 DIAGNOSIS — I10 Essential (primary) hypertension: Secondary | ICD-10-CM

## 2021-07-27 ENCOUNTER — Ambulatory Visit: Payer: Medicare Other | Admitting: Cardiology

## 2021-07-27 ENCOUNTER — Other Ambulatory Visit: Payer: Self-pay

## 2021-07-27 ENCOUNTER — Encounter: Payer: Self-pay | Admitting: Cardiology

## 2021-07-27 VITALS — BP 100/50 | HR 55 | Ht 69.0 in | Wt 181.0 lb

## 2021-07-27 DIAGNOSIS — I2510 Atherosclerotic heart disease of native coronary artery without angina pectoris: Secondary | ICD-10-CM | POA: Diagnosis not present

## 2021-07-27 DIAGNOSIS — I10 Essential (primary) hypertension: Secondary | ICD-10-CM | POA: Diagnosis not present

## 2021-07-27 DIAGNOSIS — I493 Ventricular premature depolarization: Secondary | ICD-10-CM | POA: Diagnosis not present

## 2021-07-27 DIAGNOSIS — I255 Ischemic cardiomyopathy: Secondary | ICD-10-CM | POA: Diagnosis not present

## 2021-07-27 DIAGNOSIS — E785 Hyperlipidemia, unspecified: Secondary | ICD-10-CM

## 2021-07-27 MED ORDER — LOSARTAN POTASSIUM 25 MG PO TABS: 25.0000 mg | ORAL_TABLET | Freq: Every day | ORAL | 1 refills | Status: DC

## 2021-07-27 NOTE — Progress Notes (Signed)
Cardiology Office Note:    Date:  07/27/2021   ID:  Joseph Guerra, DOB 04/29/1946, MRN [id number]  PCP:  Andreas Blower., MD  Cardiologist:  Gypsy Balsam, MD    Referring MD: Andreas Blower., MD   Chief Complaint  Patient presents with   Follow-up  Doing well but have a cough  History of Present Illness:    Joseph Guerra is a 75 y.o. male  with past medical history significant for coronary artery disease, status post myocardial infarction 2001 as well as microinfarction June 2018 that required stenting to LAD as well as multiple stent to right coronary artery.  He does have history of cardiomyopathy with ejection fraction 30% however latest estimation of 45% He is coming today to my office forfollow-up.  Denies have any chest pain tightness squeezing pressure burning in the chest, complaint is a cough.  Past Medical History:  Diagnosis Date   Arthritis associated with another disorder 12/17/2011   Atherosclerotic heart disease of native coronary artery without angina pectoris 12/17/2011   Formatting of this note might be different from the original. Myocardial infarction 2002 with apical hypokinesis Formatting of this note might be different from the original. Managed by Dr. Bing Matter   CAD (coronary artery disease)    a. prior MI in 2001 with stents placed at that time (patient unsure of which arteries)   Chronic dermatitis 10/24/2015   Coronary artery disease 12/17/2011   Overview:  Myocardial infarction 2002 with apical hypokinesis   Dyslipidemia (high LDL; low HDL) 06/18/2016   Essential hypertension 10/24/2015   Hypercholesteremia    Hyperlipidemia LDL goal <70    Hypertension    Hypokalemia    Ischemic cardiomyopathy    Ejection fraction 30% in June 2018   Knee pain, left 05/07/2016   PVC's (premature ventricular contractions) 03/28/2017   ST elevation myocardial infarction (STEMI) (HCC)    ST elevation myocardial infarction (STEMI) of anterolateral wall (HCC)  02/06/2017    Past Surgical History:  Procedure Laterality Date   CORONARY ANGIOPLASTY WITH STENT PLACEMENT     CORONARY BALLOON ANGIOPLASTY N/A 02/06/2017   Procedure: Coronary Balloon Angioplasty;  Surgeon: Iran Ouch, MD;  Location: [facility] INVASIVE CV LAB;  Service: Cardiovascular;  Laterality: N/A;   LEFT HEART CATH AND CORONARY ANGIOGRAPHY N/A 02/06/2017   Procedure: Left Heart Cath and Coronary Angiography;  Surgeon: Iran Ouch, MD;  Location: [facility] INVASIVE CV LAB;  Service: Cardiovascular;  Laterality: N/A;    Current Medications: Current Meds  Medication Sig   aspirin 81 MG chewable tablet Chew 1 tablet (81 mg total) by mouth daily.   atorvastatin (LIPITOR) 80 MG tablet TAKE 1 TABLET(80 MG) BY MOUTH DAILY (Patient taking differently: Take 80 mg by mouth daily at 4 PM.)   carvedilol (COREG) 12.5 MG tablet TAKE 1 TABLET BY MOUTH TWICE DAILY WITH MEALS (Patient taking differently: Take 12.5 mg by mouth 2 (two) times daily with a meal.)   folic acid (FOLVITE) 400 MCG tablet Take 400 mcg by mouth daily.   isosorbide mononitrate (IMDUR) 30 MG 24 hr tablet Take 1 tablet by mouth once daily (Patient taking differently: Take 30 mg by mouth daily.)   ramipril (ALTACE) 5 MG capsule Take 1 capsule (5 mg total) by mouth daily.   spironolactone (ALDACTONE) 25 MG tablet Take 1/2 (one-half) tablet by mouth twice daily (Patient taking differently: Take 12.5 mg by mouth in the morning.)     Allergies:   Patient has no known allergies.  Social History   Socioeconomic History   Marital status: Married    Spouse name: Not on file   Number of children: Not on file   Years of education: Not on file   Highest education level: Not on file  Occupational History   Not on file  Tobacco Use   Smoking status: Former   Smokeless tobacco: Never  Vaping Use   Vaping Use: Never used  Substance and Sexual Activity   Alcohol use: Yes    Comment: occ   Drug use: No   Sexual activity: Not on  file  Other Topics Concern   Not on file  Social History Narrative   Not on file   Social Determinants of Health   Financial Resource Strain: Not on file  Food Insecurity: Not on file  Transportation Needs: Not on file  Physical Activity: Not on file  Stress: Not on file  Social Connections: Not on file     Family History: The patient's family history includes Heart disease in his father. ROS:   Please see the history of present illness.    All 14 point review of systems negative except as described per history of present illness  EKGs/Labs/Other Studies Reviewed:      Recent Labs: 01/19/2021: ALT 32  Recent Lipid Panel    Component Value Date/Time   CHOL 141 01/19/2021 0835   TRIG 78 01/19/2021 0835   HDL 56 01/19/2021 0835   CHOLHDL 2.5 01/19/2021 0835   CHOLHDL 3.0 02/07/2017 0236   VLDL 15 02/07/2017 0236   LDLCALC 70 01/19/2021 0835    Physical Exam:    VS:  BP (!) 100/50 (BP Location: Right Arm, Patient Position: Sitting)   Pulse (!) 55   Ht 5\' 9"  (1.753 m)   Wt 181 lb (82.1 kg)   SpO2 94%   BMI 26.73 kg/m     Wt Readings from Last 3 Encounters:  07/27/21 181 lb (82.1 kg)  01/18/21 177 lb (80.3 kg)  07/18/20 184 lb (83.5 kg)     GEN:  Well nourished, well developed in no acute distress HEENT: Normal NECK: No JVD; No carotid bruits LYMPHATICS: No lymphadenopathy CARDIAC: RRR, no murmurs, no rubs, no gallops RESPIRATORY:  Clear to auscultation without rales, wheezing or rhonchi  ABDOMEN: Soft, non-tender, non-distended MUSCULOSKELETAL:  No edema; No deformity  SKIN: Warm and dry LOWER EXTREMITIES: no swelling NEUROLOGIC:  Alert and oriented x 3 PSYCHIATRIC:  Normal affect   ASSESSMENT:    1. Coronary artery disease involving native coronary artery of native heart without angina pectoris   2. Essential hypertension   3. PVC's (premature ventricular contractions)   4. Ischemic cardiomyopathy   5. Hyperlipidemia LDL goal <70    PLAN:     In order of problems listed above:  Coronary disease stable did review denies have any chest pain tightness squeezing pressure burning chest, risk factors are well modified we will continue present management Essential hypertension blood pressure is actually on the lower side.  We will continue monitoring. PVCs denies having palpitations Ischemic cardiomyopathy we will repeat echocardiogram, because of cough which could be related to ACE inhibitor I will discontinue ramipril and start him on losartan 25, if echocardiogram showed diminished ejection fraction we will switch him to [facility]. Dyslipidemia, fasting lipid profile will be done today.  I did review K PN which show me data from May with LDL of 70 HDL 56   Medication Adjustments/Labs and Tests Ordered: Current medicines are reviewed  at length with the patient today.  Concerns regarding medicines are outlined above.  No orders of the defined types were placed in this encounter.  Medication changes: No orders of the defined types were placed in this encounter.   Signed, Georgeanna Lea, MD, [facility] 07/27/2021 8:46 AM    [facility]

## 2021-07-27 NOTE — Patient Instructions (Signed)
Medication Instructions:  Your physician has recommended you make the following change in your medication:    STOP: Ramipril   START: Losartan 25 mg daily.   *If you need a refill on your cardiac medications before your next appointment, please call your pharmacy*   Lab Work: Your physician recommends that you return for lab work : lipid, bmp  If you have labs (blood work) drawn today and your tests are completely normal, you will receive your results only by: MyChart Message (if you have MyChart) OR A paper copy in the mail If you have any lab test that is abnormal or we need to change your treatment, we will call you to review the results.   Testing/Procedures: None   Follow-Up: At [facility], you and your health needs are our priority.  As part of our continuing mission to provide you with exceptional heart care, we have created designated Provider Care Teams.  These Care Teams include your primary Cardiologist (physician) and Advanced Practice Providers (APPs -  Physician Assistants and Nurse Practitioners) who all work together to provide you with the care you need, when you need it.  We recommend signing up for the patient portal called "MyChart".  Sign up information is provided on this After Visit Summary.  MyChart is used to connect with patients for Virtual Visits (Telemedicine).  Patients are able to view lab/test results, encounter notes, upcoming appointments, etc.  Non-urgent messages can be sent to your provider as well.   To learn more about what you can do with MyChart, go to [email or web address].    Your next appointment:   6 month(s)  The format for your next appointment:   In Person  Provider:   Gypsy Balsam, MD    Other Instructions  Losartan Tablets What is this medication? LOSARTAN (loe SAR tan) treats high blood pressure. It may also be used to prevent a stroke in people with heart disease and high blood pressure. It can be used to prevent  kidney damage in people with diabetes. It works by relaxing the blood vessels, which helps decrease the amount of work your heart has to do. It belongs to a group of medications called ARBs. This medicine may be used for other purposes; ask your health care provider or pharmacist if you have questions. COMMON BRAND NAME(S): Cozaar What should I tell my care team before I take this medication? They need to know if you have any of these conditions: Heart failure Kidney disease Liver disease An unusual or allergic reaction to losartan, other medications, foods, dyes, or preservatives Pregnant or trying to get pregnant Breast-feeding How should I use this medication? Take this medication by mouth. Take it as directed on the prescription label at the same time every day. You can take it with or without food. If it upsets your stomach, take it with food. Keep taking it unless your care team tells you to stop. Talk to your care team about the use of this medication in children. While it may be prescribed for children as young as 6 for selected conditions, precautions do apply. Overdosage: If you think you have taken too much of this medicine contact a poison control center or emergency room at once. NOTE: This medicine is only for you. Do not share this medicine with others. What if I miss a dose? If you miss a dose, take it as soon as you can. If it is almost time for your next dose, take only that dose. Do  not take double or extra doses. What may interact with this medication? Aliskiren ACE inhibitors, like enalapril or lisinopril Diuretics, especially amiloride, eplerenone, spironolactone, or triamterene Lithium NSAIDs, medications for pain and inflammation, like ibuprofen or naproxen Potassium salts or potassium supplements This list may not describe all possible interactions. Give your health care provider a list of all the medicines, herbs, non-prescription drugs, or dietary supplements you  use. Also tell them if you smoke, drink alcohol, or use illegal drugs. Some items may interact with your medicine. What should I watch for while using this medication? Visit your care team for regular check ups. Check your blood pressure as directed. Ask your care team what your blood pressure should be. Also, find out when you should contact them. Do not treat yourself for coughs, colds, or pain while you are using this medication without asking your care team for advice. Some medications may increase your blood pressure. Women should inform their care team if they wish to become pregnant or think they might be pregnant. There is a potential for serious side effects to an unborn child. Talk to your care team for more information. You may get drowsy or dizzy. Do not drive, use machinery, or do anything that needs mental alertness until you know how this medication affects you. Do not stand or sit up quickly, especially if you are an older patient. This reduces the risk of dizzy or fainting spells. Alcohol can make you more drowsy and dizzy. Avoid alcoholic drinks. Avoid salt substitutes unless you are told otherwise by your care team. What side effects may I notice from receiving this medication? Side effects that you should report to your care team as soon as possible: Allergic reactions--skin rash, itching, hives, swelling of the face, lips, tongue, or throat High potassium level--muscle weakness, fast or irregular heartbeat Kidney injury--decrease in the amount of urine, swelling of the ankles, hands, or feet Low blood pressure--dizziness, feeling faint or lightheaded, blurry vision Side effects that usually do not require medical attention (report to your care team if they continue or are bothersome): Dizziness Headache Runny or stuffy nose This list may not describe all possible side effects. Call your doctor for medical advice about side effects. You may report side effects to FDA at  1-800-FDA-1088. Where should I keep my medication? Keep out of the reach of children and pets. Store at room temperature between 20 and 25 degrees C (68 and 77 degrees F). Protect from light. Keep the container tightly closed. Get rid of any unused medication after the expiration date. To get rid of medications that are no longer needed or have expired: Take the medication to a medication take-back program. Check with your pharmacy or law enforcement to find a location. If you cannot return the medication, check the label or package insert to see if the medication should be thrown out in the garbage or flushed down the toilet. If you are not sure, ask your care team. If it is safe to put in the trash, empty the medication out of the container. Mix the medication with cat litter, dirt, coffee grounds, or other unwanted substance. Seal the mixture in a bag or container. Put it in the trash. NOTE: This sheet is a summary. It may not cover all possible information. If you have questions about this medicine, talk to your doctor, pharmacist, or health care provider.  2022 Elsevier/Gold Standard (2021-05-02 00:00:00)

## 2021-07-29 LAB — LIPID PANEL
Chol/HDL Ratio: 2.4 ratio (ref 0.0–5.0)
Cholesterol, Total: 127 mg/dL (ref 100–199)
HDL: 54 mg/dL (ref >39)
LDL Chol Calc (NIH): 59 mg/dL (ref 0–99)
Triglycerides: 71 mg/dL (ref 0–149)
VLDL Cholesterol Cal: 14 mg/dL (ref 5–40)

## 2021-07-29 LAB — BASIC METABOLIC PANEL
BUN/Creatinine Ratio: 18 (ref 10–24)
BUN: 17 mg/dL (ref 8–27)
CO2: 25 mmol/L (ref 20–29)
Calcium: 9.6 mg/dL (ref 8.6–10.2)
Chloride: 100 mmol/L (ref 96–106)
Creatinine, Ser: 0.92 mg/dL (ref 0.76–1.27)
Glucose: 98 mg/dL (ref 70–99)
Potassium: 4.4 mmol/L (ref 3.5–5.2)
Sodium: 138 mmol/L (ref 134–144)
eGFR: 88 mL/min/{1.73_m2} (ref >59)

## 2021-08-01 ENCOUNTER — Telehealth: Payer: Self-pay | Admitting: Cardiology

## 2021-08-01 NOTE — Telephone Encounter (Signed)
Pt's daughter Montel Clock is returning call for results

## 2021-08-01 NOTE — Telephone Encounter (Signed)
Spoke to patient daughter informed her of results per dpr.

## 2021-08-25 ENCOUNTER — Ambulatory Visit (HOSPITAL_BASED_OUTPATIENT_CLINIC_OR_DEPARTMENT_OTHER): Payer: Medicare Other

## 2021-09-18 ENCOUNTER — Other Ambulatory Visit: Payer: Self-pay

## 2021-09-18 ENCOUNTER — Ambulatory Visit (HOSPITAL_BASED_OUTPATIENT_CLINIC_OR_DEPARTMENT_OTHER)
Admission: RE | Admit: 2021-09-18 | Discharge: 2021-09-18 | Disposition: A | Discharge status: Home / Self Care | Payer: Medicare Other | Source: Ambulatory Visit | Attending: Cardiology | Admitting: Cardiology

## 2021-09-18 DIAGNOSIS — I10 Essential (primary) hypertension: Secondary | ICD-10-CM | POA: Insufficient documentation

## 2021-09-18 DIAGNOSIS — I2510 Atherosclerotic heart disease of native coronary artery without angina pectoris: Secondary | ICD-10-CM | POA: Diagnosis present

## 2021-09-18 DIAGNOSIS — I255 Ischemic cardiomyopathy: Secondary | ICD-10-CM | POA: Insufficient documentation

## 2021-09-18 DIAGNOSIS — I493 Ventricular premature depolarization: Secondary | ICD-10-CM | POA: Diagnosis not present

## 2021-09-18 DIAGNOSIS — E785 Hyperlipidemia, unspecified: Secondary | ICD-10-CM | POA: Insufficient documentation

## 2021-09-18 NOTE — Progress Notes (Signed)
°  Echocardiogram 2D Echocardiogram has been performed.  Joseph Guerra F 09/18/2021, 9:16 AM

## 2021-09-19 LAB — ECHOCARDIOGRAM COMPLETE
AR max vel: 4.4 cm2
AV Area VTI: 5.14 cm2
AV Area mean vel: 4.44 cm2
AV Mean grad: 4 mmHg
AV Peak grad: 7.4 mmHg
Ao pk vel: 1.36 m/s
Area-P 1/2: 2.92 cm2
Calc EF: 41.1 %
S' Lateral: 3.1 cm
Single Plane A2C EF: 45 %
Single Plane A4C EF: 35.6 %

## 2021-09-25 ENCOUNTER — Telehealth: Payer: Self-pay

## 2021-09-25 NOTE — Telephone Encounter (Signed)
-----   Message from Georgeanna Lea, MD sent at 09/20/2021  4:15 PM EST ----- Echocardiogram showed ejection fraction 40%, diastolic dysfunctions right atrium moderately dilated, overall looks good

## 2021-09-25 NOTE — Telephone Encounter (Signed)
Patient notified of results.

## 2021-10-16 DIAGNOSIS — K52831 Collagenous colitis: Secondary | ICD-10-CM

## 2021-10-16 HISTORY — DX: Collagenous colitis: K52.831

## 2021-11-30 ENCOUNTER — Ambulatory Visit: Payer: Medicare Other | Admitting: Cardiology

## 2021-12-04 ENCOUNTER — Other Ambulatory Visit: Payer: Self-pay | Admitting: Cardiology

## 2021-12-04 DIAGNOSIS — I10 Essential (primary) hypertension: Secondary | ICD-10-CM

## 2021-12-04 DIAGNOSIS — I2510 Atherosclerotic heart disease of native coronary artery without angina pectoris: Secondary | ICD-10-CM

## 2021-12-11 ENCOUNTER — Other Ambulatory Visit: Payer: Self-pay | Admitting: Cardiology

## 2022-01-10 ENCOUNTER — Other Ambulatory Visit: Payer: Self-pay | Admitting: Cardiology

## 2022-01-10 DIAGNOSIS — I10 Essential (primary) hypertension: Secondary | ICD-10-CM

## 2022-01-10 DIAGNOSIS — I2510 Atherosclerotic heart disease of native coronary artery without angina pectoris: Secondary | ICD-10-CM

## 2022-01-17 ENCOUNTER — Other Ambulatory Visit: Payer: Self-pay | Admitting: Cardiology

## 2022-03-07 ENCOUNTER — Ambulatory Visit: Payer: Medicare Other | Admitting: Cardiology

## 2022-03-07 ENCOUNTER — Encounter: Payer: Self-pay | Admitting: Cardiology

## 2022-03-07 VITALS — BP 98/62 | HR 55 | Ht 69.0 in | Wt 180.0 lb

## 2022-03-07 DIAGNOSIS — I10 Essential (primary) hypertension: Secondary | ICD-10-CM

## 2022-03-07 DIAGNOSIS — I2510 Atherosclerotic heart disease of native coronary artery without angina pectoris: Secondary | ICD-10-CM

## 2022-03-07 DIAGNOSIS — I255 Ischemic cardiomyopathy: Secondary | ICD-10-CM

## 2022-03-07 DIAGNOSIS — I493 Ventricular premature depolarization: Secondary | ICD-10-CM

## 2022-03-07 DIAGNOSIS — R072 Precordial pain: Secondary | ICD-10-CM

## 2022-03-07 DIAGNOSIS — E785 Hyperlipidemia, unspecified: Secondary | ICD-10-CM

## 2022-03-07 NOTE — Addendum Note (Signed)
Addended by: Roosvelt Harps R on: 03/07/2022 11:06 AM   Modules accepted: Orders

## 2022-03-07 NOTE — Addendum Note (Signed)
Addended by: Roosvelt Harps R on: 03/07/2022 11:51 AM   Modules accepted: Orders

## 2022-03-07 NOTE — Patient Instructions (Addendum)
Medication Instructions:  Your physician recommends that you continue on your current medications as directed. Please refer to the Current Medication list given to you today.  *If you need a refill on your cardiac medications before your next appointment, please call your pharmacy*   Lab Work: None Ordered If you have labs (blood work) drawn today and your tests are completely normal, you will receive your results only by: MyChart Message (if you have MyChart) OR A paper copy in the mail If you have any lab test that is abnormal or we need to change your treatment, we will call you to review the results.   Testing/Procedures: Your physician has requested that you have a Cardiolyte myoview. For further information please visit [email or web address] Please follow instruction sheet, as given.  The test will take approximately 3 to 4 hours to complete; you may bring reading material.  If someone comes with you to your appointment, they will need to remain in the main lobby due to limited space in the testing area.   How to prepare for your Myocardial Perfusion Test: Do not eat or drink 3 hours prior to your test, except you may have water. Do not consume products containing caffeine (regular or decaffeinated) 12 hours prior to your test. (ex: coffee, chocolate, sodas, tea). Do bring a list of your current medications with you.  If not listed below, you may take your medications as normal. Do wear comfortable clothes (no dresses or overalls) and walking shoes, tennis shoes preferred (No heels or open toe shoes are allowed). Do NOT wear cologne, perfume, aftershave, or lotions (deodorant is allowed). If these instructions are not followed, your test will have to be rescheduled.     Follow-Up: At [facility], you and your health needs are our priority.  As part of our continuing mission to provide you with exceptional heart care, we have created designated Provider Care Teams.  These Care  Teams include your primary Cardiologist (physician) and Advanced Practice Providers (APPs -  Physician Assistants and Nurse Practitioners) who all work together to provide you with the care you need, when you need it.  We recommend signing up for the patient portal called "MyChart".  Sign up information is provided on this After Visit Summary.  MyChart is used to connect with patients for Virtual Visits (Telemedicine).  Patients are able to view lab/test results, encounter notes, upcoming appointments, etc.  Non-urgent messages can be sent to your provider as well.   To learn more about what you can do with MyChart, go to [email or web address].    Your next appointment:   5 month(s)  The format for your next appointment:   In Person  Provider:   Gypsy Balsam, MD    Other Instructions NA

## 2022-03-07 NOTE — Addendum Note (Signed)
Addended by: Baldo Ash D on: 03/07/2022 09:32 AM   Modules accepted: Orders

## 2022-03-07 NOTE — Progress Notes (Signed)
Cardiology Office Note:    Date:  03/07/2022   ID:  Joseph Guerra, DOB Aug 31, 1945, MRN [id number]  PCP:  Andreas Blower., MD  Cardiologist:  Gypsy Balsam, MD    Referring MD: Andreas Blower., MD   Chief Complaint  Patient presents with   Follow-up  I do have some shortness of breath while walking  History of Present Illness:    Joseph Guerra is a 76 y.o. male  with past medical history significant for coronary artery disease, status post myocardial infarction 2001 as well as microinfarction June 2018 that required stenting to LAD as well as multiple stent to right coronary artery.  He does have history of cardiomyopathy with ejection fraction 30% however latest estimation of 45% Comes today to muscle follow-up overall doing well majority of time however he said he walks he will get short of breath more than before denies have any swelling of lower EXTR no palpitation dizziness passing out.  Past Medical History:  Diagnosis Date   Arthritis associated with another disorder 12/17/2011   Atherosclerotic heart disease of native coronary artery without angina pectoris 12/17/2011   Formatting of this note might be different from the original. Myocardial infarction 2002 with apical hypokinesis Formatting of this note might be different from the original. Managed by Dr. Bing Matter   CAD (coronary artery disease)    a. prior MI in 2001 with stents placed at that time (patient unsure of which arteries)   Chronic dermatitis 10/24/2015   Collagenous colitis 10/16/2021   Coronary artery disease 12/17/2011   Overview:  Myocardial infarction 2002 with apical hypokinesis   Dyslipidemia (high LDL; low HDL) 06/18/2016   Essential hypertension 10/24/2015   Hypercholesteremia    Hyperlipidemia LDL goal <70    Hypertension    Hypokalemia    Ischemic cardiomyopathy    Ejection fraction 30% in June 2018   Knee pain, left 05/07/2016   PVC's (premature ventricular contractions) 03/28/2017   ST  elevation myocardial infarction (STEMI) (HCC)    ST elevation myocardial infarction (STEMI) of anterolateral wall (HCC) 02/06/2017    Past Surgical History:  Procedure Laterality Date   CORONARY ANGIOPLASTY WITH STENT PLACEMENT     CORONARY BALLOON ANGIOPLASTY N/A 02/06/2017   Procedure: Coronary Balloon Angioplasty;  Surgeon: Iran Ouch, MD;  Location: [facility] INVASIVE CV LAB;  Service: Cardiovascular;  Laterality: N/A;   LEFT HEART CATH AND CORONARY ANGIOGRAPHY N/A 02/06/2017   Procedure: Left Heart Cath and Coronary Angiography;  Surgeon: Iran Ouch, MD;  Location: [facility] INVASIVE CV LAB;  Service: Cardiovascular;  Laterality: N/A;    Current Medications: Current Meds  Medication Sig   aspirin 81 MG chewable tablet Chew 1 tablet (81 mg total) by mouth daily.   atorvastatin (LIPITOR) 80 MG tablet Take 80 mg by mouth at bedtime.   carvedilol (COREG) 12.5 MG tablet TAKE 1 TABLET BY MOUTH TWICE DAILY WITH MEALS   clobetasol cream (TEMOVATE) 0.05 % Apply 1 application topically 2 (two) times daily.   folic acid (FOLVITE) 400 MCG tablet Take 400 mcg by mouth daily.   isosorbide mononitrate (IMDUR) 30 MG 24 hr tablet Take 1 tablet by mouth once daily   losartan (COZAAR) 25 MG tablet Take 1 tablet by mouth once daily   spironolactone (ALDACTONE) 25 MG tablet Take 1/2 (one-half) tablet by mouth twice daily     Allergies:   Patient has no known allergies.   Social History   Socioeconomic History   Marital status: Married  Spouse name: Not on file   Number of children: Not on file   Years of education: Not on file   Highest education level: Not on file  Occupational History   Not on file  Tobacco Use   Smoking status: Former   Smokeless tobacco: Never  Vaping Use   Vaping Use: Never used  Substance and Sexual Activity   Alcohol use: Yes    Comment: occ   Drug use: No   Sexual activity: Not on file  Other Topics Concern   Not on file  Social History Narrative   Not on  file   Social Determinants of Health   Financial Resource Strain: Not on file  Food Insecurity: Not on file  Transportation Needs: Not on file  Physical Activity: Not on file  Stress: Not on file  Social Connections: Not on file     Family History: The patient's family history includes Heart disease in his father. ROS:   Please see the history of present illness.    All 14 point review of systems negative except as described per history of present illness  EKGs/Labs/Other Studies Reviewed:      Recent Labs: 07/28/2021: BUN 17; Creatinine, Ser 0.92; Potassium 4.4; Sodium 138  Recent Lipid Panel    Component Value Date/Time   CHOL 127 07/28/2021 0855   TRIG 71 07/28/2021 0855   HDL 54 07/28/2021 0855   CHOLHDL 2.4 07/28/2021 0855   CHOLHDL 3.0 02/07/2017 0236   VLDL 15 02/07/2017 0236   LDLCALC 59 07/28/2021 0855    Physical Exam:    VS:  BP 98/62 (BP Location: Left Arm, Patient Position: Sitting, Cuff Size: Normal)   Pulse (!) 55   Ht 5\' 9"  (1.753 m)   Wt 180 lb (81.6 kg)   SpO2 93%   BMI 26.58 kg/m     Wt Readings from Last 3 Encounters:  03/07/22 180 lb (81.6 kg)  07/27/21 181 lb (82.1 kg)  01/18/21 177 lb (80.3 kg)     GEN:  Well nourished, well developed in no acute distress HEENT: Normal NECK: No JVD; No carotid bruits LYMPHATICS: No lymphadenopathy CARDIAC: RRR, no murmurs, no rubs, no gallops RESPIRATORY:  Clear to auscultation without rales, wheezing or rhonchi  ABDOMEN: Soft, non-tender, non-distended MUSCULOSKELETAL:  No edema; No deformity  SKIN: Warm and dry LOWER EXTREMITIES: no swelling NEUROLOGIC:  Alert and oriented x 3 PSYCHIATRIC:  Normal affect   ASSESSMENT:    1. Coronary artery disease involving native coronary artery of native heart without angina pectoris   2. Essential hypertension   3. PVC's (premature ventricular contractions)   4. Dyslipidemia (high LDL; low HDL)   5. Ischemic cardiomyopathy    PLAN:    In order of  problems listed above:  Coronary artery disease, status myocardial infarction, he is having some symptoms which are atypical and nonspecific however concerning we had a long discussion about what to do with the situation I think the best approach will be to do stress test to make sure he does not have inducible ischemia.  We will do exercise Cardiolite. Essential hypertension we have opposite problem with the blood pressure being low.  Which actually limits dose of medication I can use to help with his cardiomyopathy History of cardiomyopathy ejection fraction 45% on last assessment.  He is on beta-blocker as well as small dose of losartan secondary to low blood pressure.  We will do stress test will look at ejection fraction. Dyslipidemia fasting lipid  profile reviewed from [facility] LDL 59 HDL 54 excellent high intense statin which I will continue He also complained of having morning cough I will give him instruction to start using Flonase.   Medication Adjustments/Labs and Tests Ordered: Current medicines are reviewed at length with the patient today.  Concerns regarding medicines are outlined above.  No orders of the defined types were placed in this encounter.  Medication changes: No orders of the defined types were placed in this encounter.   Signed, Georgeanna Lea, MD, [facility] 03/07/2022 9:23 AM    [facility]

## 2022-03-08 NOTE — Addendum Note (Signed)
Addended by: Baldo Ash D on: 03/08/2022 08:11 AM   Modules accepted: Orders

## 2022-03-19 ENCOUNTER — Telehealth (HOSPITAL_COMMUNITY): Payer: Self-pay | Admitting: Radiology

## 2022-03-19 NOTE — Telephone Encounter (Signed)
Left detailed instructions per Myocardial Perfusion Study Information Sheet for the test on 03/22/2022 at 8:00. Patient notified to arrive 15 minutes early and that it is imperative to arrive on time for appointment to keep from having the test rescheduled.  If you need to cancel or reschedule your appointment, please call the office within 24 hours of your appointment.  EHK

## 2022-03-22 ENCOUNTER — Ambulatory Visit (HOSPITAL_COMMUNITY): Payer: Medicare Other | Attending: Cardiovascular Disease

## 2022-03-22 DIAGNOSIS — R072 Precordial pain: Secondary | ICD-10-CM | POA: Diagnosis not present

## 2022-03-22 LAB — MYOCARDIAL PERFUSION IMAGING
LV dias vol: 164 mL (ref 62–150)
LV sys vol: 102 mL
Nuc Stress EF: 38 %
Peak HR: 76 {beats}/min
Rest HR: 52 {beats}/min
Rest Nuclear Isotope Dose: 10.8 mCi
SDS: 3
SRS: 18
SSS: 21
ST Depression (mm): 0 mm
Stress Nuclear Isotope Dose: 30 mCi
TID: 1.02

## 2022-03-22 MED ORDER — TECHNETIUM TC 99M TETROFOSMIN IV KIT
10.8000 | PACK | Freq: Once | INTRAVENOUS | Status: AC | PRN
  Administered 2022-03-22: 10.8 via INTRAVENOUS

## 2022-03-22 MED ORDER — TECHNETIUM TC 99M TETROFOSMIN IV KIT: 30.0000 | PACK | Freq: Once | INTRAVENOUS | Status: AC | PRN

## 2022-03-23 ENCOUNTER — Telehealth: Payer: Self-pay | Admitting: Cardiology

## 2022-03-23 NOTE — Telephone Encounter (Signed)
Was returning call. Please advise  

## 2022-03-26 ENCOUNTER — Telehealth: Payer: Self-pay

## 2022-03-26 NOTE — Telephone Encounter (Signed)
Results reviewed with pt as per Dr. Krasowski's note.  Pt verbalized understanding and had no additional questions. Routed to PCP  

## 2022-05-21 ENCOUNTER — Other Ambulatory Visit: Payer: Self-pay | Admitting: Cardiology

## 2022-05-21 DIAGNOSIS — I10 Essential (primary) hypertension: Secondary | ICD-10-CM

## 2022-05-21 DIAGNOSIS — I2510 Atherosclerotic heart disease of native coronary artery without angina pectoris: Secondary | ICD-10-CM

## 2022-06-17 ENCOUNTER — Other Ambulatory Visit: Payer: Self-pay | Admitting: Cardiology

## 2022-07-03 ENCOUNTER — Other Ambulatory Visit: Payer: Self-pay | Admitting: Cardiology

## 2022-07-03 DIAGNOSIS — I2510 Atherosclerotic heart disease of native coronary artery without angina pectoris: Secondary | ICD-10-CM

## 2022-07-03 DIAGNOSIS — I10 Essential (primary) hypertension: Secondary | ICD-10-CM

## 2022-07-03 NOTE — Telephone Encounter (Signed)
Rx refill sent to pharmacy. 

## 2022-07-05 ENCOUNTER — Other Ambulatory Visit: Payer: Self-pay | Admitting: Cardiology

## 2022-07-27 ENCOUNTER — Other Ambulatory Visit: Payer: Self-pay

## 2022-07-31 ENCOUNTER — Ambulatory Visit: Payer: Medicare Other | Admitting: Cardiology

## 2022-08-29 ENCOUNTER — Ambulatory Visit: Payer: Medicare Other | Attending: Cardiology | Admitting: Cardiology

## 2022-08-29 ENCOUNTER — Encounter: Payer: Self-pay | Admitting: Cardiology

## 2022-08-29 VITALS — BP 98/60 | HR 62 | Ht 69.0 in | Wt 178.0 lb

## 2022-08-29 DIAGNOSIS — I255 Ischemic cardiomyopathy: Secondary | ICD-10-CM | POA: Diagnosis not present

## 2022-08-29 DIAGNOSIS — I2510 Atherosclerotic heart disease of native coronary artery without angina pectoris: Secondary | ICD-10-CM | POA: Diagnosis not present

## 2022-08-29 DIAGNOSIS — I10 Essential (primary) hypertension: Secondary | ICD-10-CM

## 2022-08-29 DIAGNOSIS — E7800 Pure hypercholesterolemia, unspecified: Secondary | ICD-10-CM

## 2022-08-29 DIAGNOSIS — R0609 Other forms of dyspnea: Secondary | ICD-10-CM

## 2022-08-29 MED ORDER — PANTOPRAZOLE SODIUM 40 MG PO TBEC: 40.0000 mg | DELAYED_RELEASE_TABLET | Freq: Every day | ORAL | 0 refills | Status: DC

## 2022-08-29 NOTE — Progress Notes (Unsigned)
Cardiology Office Note:    Date:  08/29/2022   ID:  Joseph Guerra, DOB 07-06-1946, MRN [id number]  PCP:  Kristopher Glee., MD  Cardiologist:  Jenne Campus, MD    Referring MD: Kristopher Glee., MD   Chief Complaint  Patient presents with   Medication Refill    Pantoprazole 40 qd    History of Present Illness:    Joseph Guerra is a 77 y.o. male past medical history significant for coronary artery disease, status post myocardial infarction 2001 as well as myocardial infarction June 2019 that required stenting to LAD as well as multiple stent to right coronary artery.  He does have history of cardiomyopathy with ejection fraction 30% however last estimation of ejection fraction was 40%.  He comes today to months for follow-up he is not doing well tragedy strikes in his family.  They lost 49 years old daughter who had brain aneurysm and up having bleeding and passed.  Obviously they grieving about that.  She passed in September still he looks very shaken and he cries in my office.  He comes to me like always with his wife.  He still complain of having some shortness of breath shortness of breath at rest as well as with exercises.  So far I do not have cardiac explanation for his symptomatology.  His ejection fraction is stable I do not see any evidence of congestive heart failure physical exam, I will ask him to have proBNP done to see if there is any room for diuresis in this situation however I think he can benefit from seeing pulmonologist.  Past Medical History:  Diagnosis Date   Arthritis associated with another disorder 12/17/2011   Atherosclerotic heart disease of native coronary artery without angina pectoris 12/17/2011   Formatting of this note might be different from the original. Myocardial infarction 2002 with apical hypokinesis Formatting of this note might be different from the original. Managed by Dr. Agustin Cree   CAD (coronary artery disease)    a. prior MI in 2001 with  stents placed at that time (patient unsure of which arteries)   Chronic dermatitis 10/24/2015   Collagenous colitis 10/16/2021   Coronary artery disease 12/17/2011   Overview:  Myocardial infarction 2002 with apical hypokinesis   Dyslipidemia (high LDL; low HDL) 06/18/2016   Essential hypertension 10/24/2015   Hypercholesteremia    Hyperlipidemia LDL goal <70    Hypertension    Hypokalemia    Ischemic cardiomyopathy    Ejection fraction 30% in June 2018   Knee pain, left 05/07/2016   PVC's (premature ventricular contractions) 03/28/2017   ST elevation myocardial infarction (STEMI) ([place])    ST elevation myocardial infarction (STEMI) of anterolateral wall ([place]) 02/06/2017    Past Surgical History:  Procedure Laterality Date   CORONARY ANGIOPLASTY WITH STENT PLACEMENT     CORONARY BALLOON ANGIOPLASTY N/A 02/06/2017   Procedure: Coronary Balloon Angioplasty;  Surgeon: Wellington Hampshire, MD;  Location: [facility] CV [facility];  Service: Cardiovascular;  Laterality: N/A;   LEFT HEART CATH AND CORONARY ANGIOGRAPHY N/A 02/06/2017   Procedure: Left Heart Cath and Coronary Angiography;  Surgeon: Wellington Hampshire, MD;  Location: [place] [facility];  Service: Cardiovascular;  Laterality: N/A;    Current Medications: Current Meds  Medication Sig   ALPRAZolam (XANAX) 0.25 MG tablet Take 0.25 mg by mouth 2 (two) times daily as needed for anxiety.   aspirin 81 MG chewable tablet Chew 1 tablet (81 mg total) by mouth daily.  atorvastatin (LIPITOR) 80 MG tablet Take 80 mg by mouth at bedtime.   carvedilol (COREG) 12.5 MG tablet Take 1 tablet (12.5 mg total) by mouth 2 (two) times daily with a meal.   clobetasol cream (TEMOVATE) 0.05 % Apply 1 application topically 2 (two) times daily.   folic acid (FOLVITE) 400 MCG tablet Take 400 mcg by mouth daily.   isosorbide mononitrate (IMDUR) 30 MG 24 hr tablet Take 1 tablet by mouth once daily (Patient taking differently: Take 30 mg by mouth daily.)   losartan (COZAAR)  25 MG tablet Take 1 tablet by mouth once daily (Patient taking differently: Take 25 mg by mouth daily.)   pantoprazole (PROTONIX) 40 MG tablet Take 40 mg by mouth daily.   spironolactone (ALDACTONE) 25 MG tablet Take 0.5 tablets (12.5 mg total) by mouth 2 (two) times daily.     Allergies:   Patient has no known allergies.   Social History   Socioeconomic History   Marital status: Married    Spouse name: Not on file   Number of children: Not on file   Years of education: Not on file   Highest education level: Not on file  Occupational History   Not on file  Tobacco Use   Smoking status: Former   Smokeless tobacco: Never  Vaping Use   Vaping Use: Never used  Substance and Sexual Activity   Alcohol use: Yes    Comment: occ   Drug use: No   Sexual activity: Not on file  Other Topics Concern   Not on file  Social History Narrative   Not on file   Social Determinants of Health   Financial Resource Strain: Not on file  Food Insecurity: Not on file  Transportation Needs: Not on file  Physical Activity: Not on file  Stress: Not on file  Social Connections: Not on file     Family History: The patient's family history includes Heart disease in his father. ROS:   Please see the history of present illness.    All 14 point review of systems negative except as described per history of present illness  EKGs/Labs/Other Studies Reviewed:      Recent Labs: No results found for requested labs within last 365 days.  Recent Lipid Panel    Component Value Date/Time   CHOL 127 07/28/2021 0855   TRIG 71 07/28/2021 0855   HDL 54 07/28/2021 0855   CHOLHDL 2.4 07/28/2021 0855   CHOLHDL 3.0 02/07/2017 0236   VLDL 15 02/07/2017 0236   LDLCALC 59 07/28/2021 0855    Physical Exam:    VS:  BP 98/60 (BP Location: Left Arm, Patient Position: Sitting)   Pulse 62   Ht 5\' 9"  (1.753 m)   Wt 178 lb (80.7 kg)   SpO2 (!) 88%   BMI 26.29 kg/m     Wt Readings from Last 3 Encounters:   08/29/22 178 lb (80.7 kg)  03/22/22 180 lb (81.6 kg)  03/07/22 180 lb (81.6 kg)     GEN:  Well nourished, well developed in no acute distress HEENT: Normal NECK: No JVD; No carotid bruits LYMPHATICS: No lymphadenopathy CARDIAC: RRR, no murmurs, no rubs, no gallops RESPIRATORY: Some wheezes in the right side lower lobe ABDOMEN: Soft, non-tender, non-distended MUSCULOSKELETAL:  No edema; No deformity  SKIN: Warm and dry LOWER EXTREMITIES: no swelling NEUROLOGIC:  Alert and oriented x 3 PSYCHIATRIC:  Normal affect   ASSESSMENT:    1. Coronary artery disease involving native coronary artery of  native heart without angina pectoris   2. Essential hypertension   3. Ischemic cardiomyopathy   4. Hypercholesteremia    PLAN:    In order of problems listed above:  Coronary artery disease stress test reviewed from summer negative.  He does not have any symptoms that would suggest reactivation of the problem. Essential hypertension however dealing with open the problem meaning blood pressure being low. Ischemic cardiomyopathy I did review echocardiogram done by group from [facility].  Ejection fraction 45%.  There is some question about PFO.  I told him that the hemodynamically is most likely insignificant. Will be do echocardiogram with bubble study to clarify his but again I do not think this is hemodynamically significant. Dyspnea on exertion with some auscultatory finding on the physical exam.  Will schedule him to have appointment with pulmonary, anticipate him to have CT of the chest   Medication Adjustments/Labs and Tests Ordered: Current medicines are reviewed at length with the patient today.  Concerns regarding medicines are outlined above.  No orders of the defined types were placed in this encounter.  Medication changes: No orders of the defined types were placed in this encounter.   Signed, Park Liter, MD, [facility] 08/29/2022 12:04 PM    [place]

## 2022-08-29 NOTE — Patient Instructions (Addendum)
Medication Instructions:   START: Pantoprazole 40mg  1 tablet daily - 2 months   Lab Work: 2nd [place] - [facility] - today If you have labs (blood work) drawn today and your tests are completely normal, you will receive your results only by: [employer] (if you have MyChart) OR A paper copy in the mail If you have any lab test that is abnormal or we need to change your treatment, we will call you to review the results.   Testing/Procedures: None Ordered   Follow-Up: At [facility], you and your health needs are our priority.  As part of our continuing mission to provide you with exceptional heart care, we have created designated Provider Care Teams.  These Care Teams include your primary Cardiologist (physician) and Advanced Practice Providers (APPs -  Physician Assistants and Nurse Practitioners) who all work together to provide you with the care you need, when you need it.  We recommend signing up for the patient portal called "MyChart".  Sign up information is provided on this After Visit Summary.  MyChart is used to connect with patients for Virtual Visits (Telemedicine).  Patients are able to view lab/test results, encounter notes, upcoming appointments, etc.  Non-urgent messages can be sent to your provider as well.   To learn more about what you can do with MyChart, go to [email or web address].    Your next appointment:   4 month(s)  The format for your next appointment:   In Person  Provider:   Jenne Campus, MD    Other Instructions Referral to Pulmonology- They will call for appt

## 2022-08-30 LAB — PRO B NATRIURETIC PEPTIDE: NT-Pro BNP: 188 pg/mL (ref 0–376)

## 2022-08-30 LAB — TSH: TSH: 1.55 u[IU]/mL (ref 0.450–4.500)

## 2022-09-06 ENCOUNTER — Telehealth: Payer: Self-pay

## 2022-09-06 ENCOUNTER — Ambulatory Visit: Payer: Medicare Other | Admitting: Cardiology

## 2022-09-07 ENCOUNTER — Telehealth: Payer: Self-pay | Admitting: Cardiology

## 2022-09-07 NOTE — Telephone Encounter (Signed)
Patient's daughter returning call for lab results. She requests to call go to her at: [phone].

## 2022-09-07 NOTE — Telephone Encounter (Signed)
Results reviewed with Starling Manns per DPR as per Dr. Wendy Poet note.  Starling Manns verbalized understanding and had no additional questions. Routed to PCP

## 2022-09-11 ENCOUNTER — Telehealth: Payer: Self-pay

## 2022-09-11 NOTE — Telephone Encounter (Signed)
S/w Zofia , notified of results

## 2022-09-11 NOTE — Telephone Encounter (Signed)
-----  Message from Park Liter, MD sent at 09/04/2022  2:22 PM EST ----- All blood tests are good, continue present management

## 2022-09-19 NOTE — Progress Notes (Signed)
Synopsis: Referred for DOE by Andreas Blower., MD  Subjective:   PATIENT ID: Joseph Guerra GENDER: male DOB: 1946-03-02, MRN: [id number]  Chief Complaint  Patient presents with   Consult    Difficulty breathing, gets fatigued easily, slurred speech    76yM with history of STEMI, ICM followed by Dr. Bing Matter, HTN, referred for DOE  Trouble breathing with exertion over a year ago, worsening especially over last several months. Doesn't usually have a cough but most mornings however he does cough up some phlegm. Later says he does have a cough productive of phlegm through much of the day. He has never tried an inhaler.   They have also noticed change in speech for the last few months. Speech just isn't as clear. Some choking on food/liquid over the last few months.   Otherwise pertinent review of systems is negative.  Smoked 20 py or so quit 20 ya. Worked in Retail buyer, Ambulance person.   Past Medical History:  Diagnosis Date   Arthritis associated with another disorder 12/17/2011   Atherosclerotic heart disease of native coronary artery without angina pectoris 12/17/2011   Formatting of this note might be different from the original. Myocardial infarction 2002 with apical hypokinesis Formatting of this note might be different from the original. Managed by Dr. Bing Matter   CAD (coronary artery disease)    a. prior MI in 2001 with stents placed at that time (patient unsure of which arteries)   Chronic dermatitis 10/24/2015   Collagenous colitis 10/16/2021   Coronary artery disease 12/17/2011   Overview:  Myocardial infarction 2002 with apical hypokinesis   Dyslipidemia (high LDL; low HDL) 06/18/2016   Essential hypertension 10/24/2015   Hypercholesteremia    Hyperlipidemia LDL goal <70    Hypertension    Hypokalemia    Ischemic cardiomyopathy    Ejection fraction 30% in June 2018   Knee pain, left 05/07/2016   PVC's (premature ventricular contractions) 03/28/2017   ST elevation  myocardial infarction (STEMI) (HCC)    ST elevation myocardial infarction (STEMI) of anterolateral wall (HCC) 02/06/2017     Family History  Problem Relation Age of Onset   Heart disease Father      Past Surgical History:  Procedure Laterality Date   CORONARY ANGIOPLASTY WITH STENT PLACEMENT     CORONARY BALLOON ANGIOPLASTY N/A 02/06/2017   Procedure: Coronary Balloon Angioplasty;  Surgeon: Iran Ouch, MD;  Location: [facility] INVASIVE CV LAB;  Service: Cardiovascular;  Laterality: N/A;   LEFT HEART CATH AND CORONARY ANGIOGRAPHY N/A 02/06/2017   Procedure: Left Heart Cath and Coronary Angiography;  Surgeon: Iran Ouch, MD;  Location: [facility] INVASIVE CV LAB;  Service: Cardiovascular;  Laterality: N/A;    Social History   Socioeconomic History   Marital status: Married    Spouse name: Not on file   Number of children: Not on file   Years of education: Not on file   Highest education level: Not on file  Occupational History   Not on file  Tobacco Use   Smoking status: Former   Smokeless tobacco: Never  Vaping Use   Vaping Use: Never used  Substance and Sexual Activity   Alcohol use: Yes    Comment: occ   Drug use: No   Sexual activity: Not on file  Other Topics Concern   Not on file  Social History Narrative   Not on file   Social Determinants of Health   Financial Resource Strain: Not on file  Food Insecurity:  Not on file  Transportation Needs: Not on file  Physical Activity: Not on file  Stress: Not on file  Social Connections: Not on file  Intimate Partner Violence: Not on file     No Known Allergies   Outpatient Medications Prior to Visit  Medication Sig Dispense Refill   aspirin 81 MG chewable tablet Chew 1 tablet (81 mg total) by mouth daily. 30 tablet 11   atorvastatin (LIPITOR) 80 MG tablet Take 80 mg by mouth at bedtime.     carvedilol (COREG) 12.5 MG tablet Take 1 tablet (12.5 mg total) by mouth 2 (two) times daily with a meal. 180 tablet 2    clobetasol cream (TEMOVATE) 1.66 % Apply 1 application topically 2 (two) times daily.     folic acid (FOLVITE) 063 MCG tablet Take 400 mcg by mouth daily.     isosorbide mononitrate (IMDUR) 30 MG 24 hr tablet Take 1 tablet by mouth once daily (Patient taking differently: Take 30 mg by mouth daily.) 90 tablet 2   losartan (COZAAR) 25 MG tablet Take 1 tablet by mouth once daily (Patient taking differently: Take 25 mg by mouth daily.) 90 tablet 1   pantoprazole (PROTONIX) 40 MG tablet Take 1 tablet (40 mg total) by mouth daily. 90 tablet 0   spironolactone (ALDACTONE) 25 MG tablet Take 0.5 tablets (12.5 mg total) by mouth 2 (two) times daily. 90 tablet 2   ALPRAZolam (XANAX) 0.25 MG tablet Take 0.25 mg by mouth 2 (two) times daily as needed for anxiety. (Patient not taking: Reported on 09/21/2022)     Facility-Administered Medications Prior to Visit  Medication Dose Route Frequency Provider Last Rate Last Admin   technetium tetrofosmin (TC-MYOVIEW) injection 30 millicurie  30 millicurie Intravenous Once PRN Josue Hector, MD           Objective:   Physical Exam:  General appearance: 77 y.o., male, NAD, conversant  Eyes: anicteric sclerae; PERRL, tracking appropriately HENT: NCAT; MMM Neck: Trachea midline; no lymphadenopathy, no JVD Lungs: CTAB, no crackles, no wheeze, with normal respiratory effort CV: RRR, no murmur  Abdomen: Soft, non-tender; non-distended, BS present  Extremities: No peripheral edema, warm Skin: Normal turgor and texture; no rash Psych: Appropriate affect Neuro: Alert and oriented to person and place, no focal deficit     Vitals:   09/21/22 1526  BP: 112/64  Pulse: (!) 55  SpO2: 90%  Weight: 175 lb 6.4 oz (79.6 kg)  Height: 5\' 9"  (1.753 m)   90% on RA BMI Readings from Last 3 Encounters:  09/21/22 25.90 kg/m  08/29/22 26.29 kg/m  03/22/22 26.58 kg/m   Wt Readings from Last 3 Encounters:  09/21/22 175 lb 6.4 oz (79.6 kg)  08/29/22 178 lb (80.7  kg)  03/22/22 180 lb (81.6 kg)     CBC    Component Value Date/Time   WBC 5.5 12/16/2017 1140   WBC 8.5 02/07/2017 0236   RBC 4.23 12/16/2017 1140   RBC 4.19 (L) 02/07/2017 0236   HGB 13.1 12/16/2017 1140   HCT 40.0 12/16/2017 1140   PLT 218 12/16/2017 1140   MCV 95 12/16/2017 1140   MCH 31.0 12/16/2017 1140   MCH 30.3 02/07/2017 0236   MCHC 32.8 12/16/2017 1140   MCHC 32.7 02/07/2017 0236   RDW 13.6 12/16/2017 1140    Chest Imaging: CXR 2018 unremarkable  Pulmonary Functions Testing Results:     No data to display           Echocardiogram  09/18/21:   1. Left ventricular ejection fraction, by estimation, is 40%%. The left  ventricle has normal function. The left ventricle has no regional wall  motion abnormalities. Left ventricular diastolic parameters are consistent  with Grade II diastolic dysfunction   (pseudonormalization). Elevated left atrial pressure.   2. Right ventricular systolic function is normal. The right ventricular  size is normal. There is normal pulmonary artery systolic pressure.   3. Left atrial size was moderately dilated.   4. Right atrial size was mild to moderately dilated.   5. The mitral valve is normal in structure. Mild mitral valve  regurgitation. No evidence of mitral stenosis.   6. The aortic valve is tricuspid. Aortic valve regurgitation is not  visualized. Aortic valve sclerosis is present, with no evidence of aortic  valve stenosis.   7. There is moderate dilatation of the aortic root, measuring 39 mm.   8. The inferior vena cava is normal in size with greater than 50%  respiratory variability, suggesting right atrial pressure of 3 mmHg.    NM stress 03/22/22:   Findings are consistent with prior myocardial infarction. The study is high risk.   No ST deviation was noted.   LV perfusion is abnormal. Defect 1: There is a large defect with severe reduction in uptake present in the apical to mid anterior location(s) that is fixed.  There is abnormal wall motion in the defect area. Consistent with infarction. Defect 2: There is a large defect with moderate reduction in uptake present in the apical to basal inferolateral location(s) that is fixed.   Left ventricular function is abnormal. Global function is moderately reduced. There were multiple regional abnormalities. Nuclear stress EF: 38 %. The left ventricular ejection fraction is moderately decreased (30-44%). End diastolic cavity size is moderately enlarged. End systolic cavity size is moderately enlarged.   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 Borderline hypoxia may be related to these latter issues or to possibility of PFO. Less likely consideration would be something neurodegenerative in context of his recent dysarthria, OP dysphagia.   # Dysarthria # Dysphagia Hard to tell if truly dysarthric due to language barrier but daughter insists his speech is slurred as if he's been drinking for the last several months. Over same time frame has also developed oropharyngeal dysphagia.   Plan: - CT chest you will be called to schedule - PFTs we will schedule on same day as next visit - start stiolto 2 puffs once daily everyday. Need to stop your inhaler 2 days before your breathing tests.  - referral for speech/barium swallow placed - referral for neurology placed for change in speech, swallowing issues - see you in 8 weeks or sooner if need be!     Maryjane Hurter, MD [facility] 09/21/2022 4:28 PM

## 2022-09-21 ENCOUNTER — Institutional Professional Consult (permissible substitution): Payer: Medicare Other | Admitting: Internal Medicine

## 2022-09-21 ENCOUNTER — Ambulatory Visit: Payer: Medicare Other | Admitting: Student

## 2022-09-21 ENCOUNTER — Encounter: Payer: Self-pay | Admitting: Student

## 2022-09-21 ENCOUNTER — Telehealth: Payer: Self-pay | Admitting: Student

## 2022-09-21 VITALS — BP 112/64 | HR 55 | Ht 69.0 in | Wt 175.4 lb

## 2022-09-21 DIAGNOSIS — R1312 Dysphagia, oropharyngeal phase: Secondary | ICD-10-CM

## 2022-09-21 DIAGNOSIS — R0600 Dyspnea, unspecified: Secondary | ICD-10-CM

## 2022-09-21 DIAGNOSIS — R471 Dysarthria and anarthria: Secondary | ICD-10-CM

## 2022-09-21 MED ORDER — STIOLTO RESPIMAT 2.5-2.5 MCG/ACT IN AERS: 2.0000 | INHALATION_SPRAY | Freq: Every day | RESPIRATORY_TRACT | 0 refills | Status: DC

## 2022-09-21 NOTE — Telephone Encounter (Signed)
What is least expensive laba/lama for him?  Thanks!  

## 2022-09-21 NOTE — Patient Instructions (Addendum)
-  CT chest you will be called to schedule - PFTs we will schedule on same day as next visit - start stiolto 2 puffs once daily everyday. Need to stop your inhaler 2 days before your breathing tests.  - referral for speech/barium swallow placed - referral for neurology placed for change in speech, swallowing issues - see you in 8 weeks or sooner if need be!

## 2022-09-24 ENCOUNTER — Telehealth (HOSPITAL_COMMUNITY): Payer: Self-pay

## 2022-09-24 ENCOUNTER — Other Ambulatory Visit (HOSPITAL_COMMUNITY): Payer: Self-pay

## 2022-09-24 ENCOUNTER — Encounter: Payer: Self-pay | Admitting: Neurology

## 2022-09-24 DIAGNOSIS — R1310 Dysphagia, unspecified: Secondary | ICD-10-CM

## 2022-09-24 NOTE — Telephone Encounter (Signed)
Covered LABA/LAMA are Stiolto Respimat, Anoro Ellipta and Bevespi for a $47.00 co-pay each at this time.

## 2022-09-24 NOTE — Telephone Encounter (Signed)
Attempted to contact patient via interpreter services to schedule Modified Barium Swallow - left voicemail.

## 2022-10-01 ENCOUNTER — Ambulatory Visit (HOSPITAL_COMMUNITY)
Admission: RE | Admit: 2022-10-01 | Discharge: 2022-10-01 | Disposition: A | Discharge status: Home / Self Care | Payer: Medicare Other | Source: Ambulatory Visit | Attending: Internal Medicine | Admitting: Internal Medicine

## 2022-10-01 DIAGNOSIS — R1312 Dysphagia, oropharyngeal phase: Secondary | ICD-10-CM | POA: Diagnosis not present

## 2022-10-01 DIAGNOSIS — R1310 Dysphagia, unspecified: Secondary | ICD-10-CM

## 2022-10-01 NOTE — Progress Notes (Signed)
Modified Barium Swallow Progress Note  Patient Details  Name: Joseph Guerra MRN: [id number] Date of Birth: 08-19-1946  Today's Date: 10/01/2022  Modified Barium Swallow completed.  Full report located under Chart Review in the Imaging Section.  Brief recommendations include the following:  Clinical Impression  Pt demonstrates normal swallow function. There are instances of flash penetration with thin liquids, WNL for age. Pt does not aspirate or show any signs of neuromuscular weakness impacting swallow. He does have appearance of bony protrusion on cervical spine impacting bolus flow at C5/6, though there is no residue or backflow on this study.  No diet modification needed. SLP discussed dysarthria further with daughter and pt. Pt able to count to ten in English for SLP, no placement or distortions other than normal dialectical differences as a primary [place] speaker. Pt and daughter report that the problem is more like decreased breath support and low volume with connected speech. Perhaps problem is more of a respiratory impairment impacting voice. Pt may benefit from f/u with SLP for compensatory strategies if function does not improve with medical management of respiratory function.   Swallow Evaluation Recommendations       SLP Diet Recommendations: Regular solids;Thin liquid   Liquid Administration via: Cup;Straw   Medication Administration: Whole meds with liquid   Supervision: Patient able to self feed       Postural Changes: Seated upright at 90 degrees            Mattheo Swindle, Katherene Ponto 10/01/2022,2:06 PM

## 2022-10-03 ENCOUNTER — Other Ambulatory Visit (INDEPENDENT_AMBULATORY_CARE_PROVIDER_SITE_OTHER): Payer: Medicare Other

## 2022-10-03 ENCOUNTER — Ambulatory Visit: Payer: Medicare Other | Admitting: Neurology

## 2022-10-03 ENCOUNTER — Encounter: Payer: Self-pay | Admitting: Neurology

## 2022-10-03 VITALS — BP 134/77 | HR 59 | Ht 69.0 in | Wt 178.0 lb

## 2022-10-03 DIAGNOSIS — R0600 Dyspnea, unspecified: Secondary | ICD-10-CM | POA: Diagnosis not present

## 2022-10-03 DIAGNOSIS — R471 Dysarthria and anarthria: Secondary | ICD-10-CM | POA: Diagnosis not present

## 2022-10-03 MED ORDER — STIOLTO RESPIMAT 2.5-2.5 MCG/ACT IN AERS: 2.0000 | INHALATION_SPRAY | Freq: Every day | RESPIRATORY_TRACT | 11 refills | Status: DC

## 2022-10-03 NOTE — Progress Notes (Signed)
[facility] Clinic Note - Initial Visit   Date: 10/03/2022   Joseph Guerra MRN: [id number] DOB: 19-Feb-1946   Dear Dr. Verlee Monte:  Thank you for your kind referral of Joseph Guerra for consultation of dysarthria. Although his history is well known to you, please allow [place] to reiterate it for the purpose of our medical record. The patient was accompanied to the clinic by daughter who also provides collateral information.     Joseph Guerra is a 77 y.o. right-handed male with hypertension, GERD, CAD, hyperlipidemia, and CAD presenting for evaluation of dysarthria.   IMPRESSION/PLAN: Dysarthria (subjective) and dyspnea.  His neurological exam does not disclose any bulbar or limb weakness.  No findings to suggest neuromuscular junction disorder, ALS, myopathy, or primary neurodegenerative condition.  To be complete, I will check AChR antibodies.  If he has progression of symptoms, NCS/EMG can be considered going forward.   ------------------------------------------------------------- History of present illness: Over the past 6-12 months, he feels that he gets mucus buildup which causes shortness of breath.  It is worse when he wakes up in the morning. Once he clears the mucus, shortness of breath improves.  Daughter says his speech is not as clear and especially with prolonged speaking, he tends to mumble.  He denies double vision, droopiness of the eye lids or limb weakness.  He was having some difficulty with swallowing so had MBS which was normal.  Patient feels that his shortness of breath is slightly better than a few weeks ago.    Out-side paper records, electronic medical record, and images have been reviewed where available and summarized as:  No results found for: "HGBA1C" No results found for: "VITAMINB12" Lab Results  Component Value Date   TSH 1.550 08/29/2022    Past Medical History:  Diagnosis Date   Arthritis associated with another  disorder 12/17/2011   Atherosclerotic heart disease of native coronary artery without angina pectoris 12/17/2011   Formatting of this note might be different from the original. Myocardial infarction 2002 with apical hypokinesis Formatting of this note might be different from the original. Managed by Dr. Agustin Cree   CAD (coronary artery disease)    a. prior MI in 2001 with stents placed at that time (patient unsure of which arteries)   Chronic dermatitis 10/24/2015   Collagenous colitis 10/16/2021   Coronary artery disease 12/17/2011   Overview:  Myocardial infarction 2002 with apical hypokinesis   Dyslipidemia (high LDL; low HDL) 06/18/2016   Essential hypertension 10/24/2015   Hypercholesteremia    Hyperlipidemia LDL goal <70    Hypertension    Hypokalemia    Ischemic cardiomyopathy    Ejection fraction 30% in June 2018   Knee pain, left 05/07/2016   PVC's (premature ventricular contractions) 03/28/2017   ST elevation myocardial infarction (STEMI) ([place])    ST elevation myocardial infarction (STEMI) of anterolateral wall ([place]) 02/06/2017    Past Surgical History:  Procedure Laterality Date   CORONARY ANGIOPLASTY WITH STENT PLACEMENT     CORONARY BALLOON ANGIOPLASTY N/A 02/06/2017   Procedure: Coronary Balloon Angioplasty;  Surgeon: Wellington Hampshire, MD;  Location: [facility];  Service: Cardiovascular;  Laterality: N/A;   LEFT HEART CATH AND CORONARY ANGIOGRAPHY N/A 02/06/2017   Procedure: Left Heart Cath and Coronary Angiography;  Surgeon: Wellington Hampshire, MD;  Location: [facility];  Service: Cardiovascular;  Laterality: N/A;     Medications:  Outpatient Encounter Medications as of 10/03/2022  Medication Sig   aspirin 81 MG  chewable tablet Chew 1 tablet (81 mg total) by mouth daily.   atorvastatin (LIPITOR) 80 MG tablet Take 80 mg by mouth at bedtime.   carvedilol (COREG) 12.5 MG tablet Take 1 tablet (12.5 mg total) by mouth 2 (two) times daily with a meal.   clobetasol  cream (TEMOVATE) 7.85 % Apply 1 application topically 2 (two) times daily.   folic acid (FOLVITE) 885 MCG tablet Take 400 mcg by mouth daily.   isosorbide mononitrate (IMDUR) 30 MG 24 hr tablet Take 1 tablet by mouth once daily (Patient taking differently: Take 30 mg by mouth daily.)   losartan (COZAAR) 25 MG tablet Take 1 tablet by mouth once daily (Patient taking differently: Take 25 mg by mouth daily.)   pantoprazole (PROTONIX) 40 MG tablet Take 1 tablet (40 mg total) by mouth daily.   spironolactone (ALDACTONE) 25 MG tablet Take 0.5 tablets (12.5 mg total) by mouth 2 (two) times daily.   Tiotropium Bromide-Olodaterol (STIOLTO RESPIMAT) 2.5-2.5 MCG/ACT AERS Inhale 2 puffs into the lungs daily.   Facility-Administered Encounter Medications as of 10/03/2022  Medication   technetium tetrofosmin (TC-MYOVIEW) injection 30 millicurie    Allergies: No Known Allergies  Family History: Family History  Problem Relation Age of Onset   Heart disease Father     Social History: Social History   Tobacco Use   Smoking status: Former   Smokeless tobacco: Never  Scientific laboratory technician Use: Never used  Substance Use Topics   Alcohol use: Yes    Comment: occasional. Glass of wine once in awhile   Drug use: No   Social History   Social History Narrative   Right Handed    Lives in a one story home with his wife.     Vital Signs:  BP 134/77   Pulse (!) 59   Ht 5\' 9"  (1.753 m)   Wt 178 lb (80.7 kg)   SpO2 92%   BMI 26.29 kg/m    Neurological Exam: MENTAL STATUS including orientation to time, place, person, recent and remote memory, attention span and concentration, language, and fund of knowledge is normal.  Speech is soft, not dysarthric.  Lingual and gutteral sounds intact.   CRANIAL NERVES: II:  No visual field defects.     III-IV-VI: Pupils equal round and reactive to light.  Normal conjugate, extra-ocular eye movements in all directions of gaze.  No nystagmus.  No ptosis at rest or  with sustained upgaze.   V:  Normal facial sensation.    VII:  Normal facial symmetry and movements.  Facial muscles including orbicularis oculi, buccinator, and orbicularis oris is 5/5. VIII:  Normal hearing and vestibular function.   IX-X:  Normal palatal movement.   XI:  Normal shoulder shrug and head rotation.   XII:  Normal tongue strength and range of motion, no deviation or fasciculation.  MOTOR:  Motor strength is 5/5 throughout, including neck flexion.  No muscle fatigability.  No atrophy, fasciculations or abnormal movements.  No pronator drift.   MSRs:                                           Right        Left brachioradialis 2+  2+  biceps 2+  2+  triceps 2+  2+  patellar 2+  2+  ankle jerk 2+  2+  Hoffman no  no  plantar response down  down   SENSORY:  Normal and symmetric perception of light touch, pinprick, vibration, and temperature.  Romberg's sign absent.   COORDINATION/GAIT: Normal finger-to- nose-finger.  Intact rapid alternating movements bilaterally.  Able to rise from a chair without using arms.  Gait narrow based and stable. Tandem and stressed gait intact.     Thank you for allowing me to participate in patient's care.  If I can answer any additional questions, I would be pleased to do so.    Sincerely,    Phil Michels K. Posey Pronto, DO

## 2022-10-03 NOTE — Patient Instructions (Addendum)
Check labs.  We will notify you of the results and let you know the next step.

## 2022-10-18 ENCOUNTER — Ambulatory Visit
Admission: RE | Admit: 2022-10-18 | Discharge: 2022-10-18 | Disposition: A | Discharge status: Home / Self Care | Payer: Medicare Other | Source: Ambulatory Visit | Attending: Student | Admitting: Student

## 2022-10-18 DIAGNOSIS — R0600 Dyspnea, unspecified: Secondary | ICD-10-CM

## 2022-10-19 LAB — MYASTHENIA GRAVIS PANEL 2
A CHR BINDING ABS: <0.3 nmol/L
ACHR Blocking Abs: <15 % Inhibition (ref <15)
Acetylchol Modul Ab: <1 % Inhibition

## 2022-11-09 ENCOUNTER — Ambulatory Visit (INDEPENDENT_AMBULATORY_CARE_PROVIDER_SITE_OTHER): Payer: Medicare Other | Admitting: Student

## 2022-11-09 DIAGNOSIS — R0600 Dyspnea, unspecified: Secondary | ICD-10-CM | POA: Diagnosis not present

## 2022-11-09 LAB — PULMONARY FUNCTION TEST
DL/VA % pred: 107 %
DL/VA: 4.3 ml/min/mmHg/L
DLCO cor % pred: 73 %
DLCO cor: 18.09 ml/min/mmHg
DLCO unc % pred: 73 %
DLCO unc: 18.09 ml/min/mmHg
FEF 25-75 Post: 1.16 L/sec
FEF 25-75 Pre: 0.75 L/sec
FEF2575-%Change-Post: 55 %
FEF2575-%Pred-Post: 53 %
FEF2575-%Pred-Pre: 34 %
FEV1-%Change-Post: 10 %
FEV1-%Pred-Post: 57 %
FEV1-%Pred-Pre: 52 %
FEV1-Post: 1.72 L
FEV1-Pre: 1.55 L
FEV1FVC-%Change-Post: 0 %
FEV1FVC-%Pred-Pre: 85 %
FEV6-%Change-Post: 11 %
FEV6-%Pred-Post: 70 %
FEV6-%Pred-Pre: 63 %
FEV6-Post: 2.72 L
FEV6-Pre: 2.44 L
FEV6FVC-%Change-Post: 0 %
FEV6FVC-%Pred-Post: 104 %
FEV6FVC-%Pred-Pre: 104 %
FVC-%Change-Post: 11 %
FVC-%Pred-Post: 67 %
FVC-%Pred-Pre: 60 %
FVC-Post: 2.78 L
FVC-Pre: 2.49 L
Post FEV1/FVC ratio: 62 %
Post FEV6/FVC ratio: 98 %
Pre FEV1/FVC ratio: 62 %
Pre FEV6/FVC Ratio: 98 %
RV % pred: 155 %
RV: 3.86 L
TLC % pred: 93 %
TLC: 6.42 L

## 2022-11-09 NOTE — Progress Notes (Signed)
Full PFT Performed Today with Interpreter.

## 2022-11-09 NOTE — Patient Instructions (Signed)
Full PFT Performed Today  

## 2022-11-12 NOTE — Progress Notes (Unsigned)
Synopsis: Referred for DOE by Kristopher Guerra., MD  Subjective:   PATIENT ID: Joseph Grand GENDER: male DOB: 12-Nov-1945, MRN: [id number]  No chief complaint on file.  CC: cough  77yM with history of STEMI, ICM followed by Dr. Agustin Cree, HTN, referred for DOE  Trouble breathing with exertion over a year ago, worsening especially over last several months. Doesn't usually have a cough but most mornings however he does cough up some phlegm. Later says he does have a cough productive of phlegm through much of the day. He has never tried an inhaler.   They have also noticed change in speech for the last few months. Speech just isn't as clear. Some choking on food/liquid over the last few months.   Smoked 20 py or so quit 20 ya. Worked in Design[employer], jewellery, [employer], jewellery.   Interval HPI  He doesn't notice much difference wrt cough. DOE is improved somewhat on stiolto but he coughs quite a bit whenever he tries to inhale it, limiting his adherence.   PFT with moderately severe obstruction and borderline bronchodilator response.  He has no sinus congestion, postnasal drainage.   Otherwise pertinent review of systems is negative.  Past Medical History:  Diagnosis Date   Arthritis associated with another disorder 12/17/2011   Atherosclerotic heart disease of native coronary artery without angina pectoris 12/17/2011   Formatting of this note might be different from the original. Myocardial infarction 2002 with apical hypokinesis Formatting of this note might be different from the original. Managed by Dr. Agustin Cree   CAD (coronary artery disease)    a. prior MI in 2001 with stents placed at that time (patient unsure of which arteries)   Chronic dermatitis 10/24/2015   Collagenous colitis 10/16/2021   Coronary artery disease 12/17/2011   Overview:  Myocardial infarction 2002 with apical hypokinesis   Dyslipidemia (high LDL; low HDL) 06/18/2016   Essential hypertension 10/24/2015    Hypercholesteremia    Hyperlipidemia LDL goal <70    Hypertension    Hypokalemia    Ischemic cardiomyopathy    Ejection fraction 30% in June 2018   Knee pain, left 05/07/2016   PVC's (premature ventricular contractions) 03/28/2017   ST elevation myocardial infarction (STEMI) ([place])    ST elevation myocardial infarction (STEMI) of anterolateral wall ([place]) 02/06/2017     Family History  Problem Relation Age of Onset   Heart disease Father      Past Surgical History:  Procedure Laterality Date   CORONARY ANGIOPLASTY WITH STENT PLACEMENT     CORONARY BALLOON ANGIOPLASTY N/A 02/06/2017   Procedure: Coronary Balloon Angioplasty;  Surgeon: Wellington Hampshire, MD;  Location: [place] [facility];  Service: Cardiovascular;  Laterality: N/A;   LEFT HEART CATH AND CORONARY ANGIOGRAPHY N/A 02/06/2017   Procedure: Left Heart Cath and Coronary Angiography;  Surgeon: Wellington Hampshire, MD;  Location: [place] [facility];  Service: Cardiovascular;  Laterality: N/A;    Social History   Socioeconomic History   Marital status: Married    Spouse name: Not on file   Number of children: Not on file   Years of education: Not on file   Highest education level: Not on file  Occupational History   Not on file  Tobacco Use   Smoking status: Former   Smokeless tobacco: Never  Vaping Use   Vaping Use: Never used  Substance and Sexual Activity   Alcohol use: Yes    Comment: occasional. Glass of wine once in awhile  Drug use: No   Sexual activity: Not on file  Other Topics Concern   Not on file  Social History Narrative   Right Handed    Lives in a one story home with his wife.    Social Determinants of Health   Financial Resource Strain: Not on file  Food Insecurity: Not on file  Transportation Needs: Not on file  Physical Activity: Not on file  Stress: Not on file  Social Connections: Not on file  Intimate Partner Violence: Not on file     No Known Allergies   Outpatient Medications Prior  to Visit  Medication Sig Dispense Refill   aspirin 81 MG chewable tablet Chew 1 tablet (81 mg total) by mouth daily. 30 tablet 11   atorvastatin (LIPITOR) 80 MG tablet Take 80 mg by mouth at bedtime.     carvedilol (COREG) 12.5 MG tablet Take 1 tablet (12.5 mg total) by mouth 2 (two) times daily with a meal. 180 tablet 2   clobetasol cream (TEMOVATE) [id number] % Apply 1 application topically 2 (two) times daily.     folic acid (FOLVITE) [id number] MCG tablet Take 400 mcg by mouth daily.     isosorbide mononitrate (IMDUR) 30 MG 24 hr tablet Take 1 tablet by mouth once daily (Patient taking differently: Take 30 mg by mouth daily.) 90 tablet 2   losartan (COZAAR) 25 MG tablet Take 1 tablet by mouth once daily (Patient taking differently: Take 25 mg by mouth daily.) 90 tablet 1   pantoprazole (PROTONIX) 40 MG tablet Take 1 tablet (40 mg total) by mouth daily. 90 tablet 0   spironolactone (ALDACTONE) 25 MG tablet Take 0.5 tablets (12.5 mg total) by mouth 2 (two) times daily. 90 tablet 2   Tiotropium Bromide-Olodaterol (STIOLTO RESPIMAT) 2.5-2.5 MCG/ACT AERS Inhale 2 puffs into the lungs daily. 1 each 11   Tiotropium Bromide-Olodaterol (STIOLTO RESPIMAT) 2.5-2.5 MCG/ACT AERS Inhale 2 puffs into the lungs daily. 4 g 0   Facility-Administered Medications Prior to Visit  Medication Dose Route Frequency Provider Last Rate Last Admin   technetium tetrofosmin (TC-MYOVIEW) injection 30 millicurie  30 millicurie Intravenous Once PRN Josue Hector, MD           Objective:   Physical Exam:  General appearance: 77 y.o., male, NAD, conversant, male, NAD, conversant  Eyes: anicteric sclerae; PERRL, tracking appropriately HENT: NCAT; MMM Neck: Trachea midline; no lymphadenopathy, no JVD Lungs: CTAB, no crackles, no wheeze, with normal respiratory effort CV: RRR, no murmur  Abdomen: Soft, non-tender; non-distended, BS present  Extremities: No peripheral edema, warm Skin: Normal turgor and texture; no rash Psych: Appropriate  affect Neuro: Alert and oriented to person and place, no focal deficit     Vitals:   11/13/22 1543  BP: 114/62  Pulse: (!) 56  Temp: (!) 97.5 F (36.4 C)  TempSrc: Oral  SpO2: 91%  Weight: 173 lb 6.4 oz (78.7 kg)  Height: 5\' 9"  (1.753 m)    91% on RA BMI Readings from Last 3 Encounters:  11/13/22 25.61 kg/m  10/03/22 26.29 kg/m  09/21/22 25.90 kg/m   Wt Readings from Last 3 Encounters:  11/13/22 173 lb 6.4 oz (78.7 kg)  10/03/22 178 lb (80.7 kg)  09/21/22 175 lb 6.4 oz (79.6 kg)     CBC    Component Value Date/Time   WBC 5.5 12/16/2017 1140   WBC 8.5 02/07/2017 0236   RBC 4.23 12/16/2017 1140   RBC 4.19 (L) 02/07/2017 0236   HGB 13.1 12/16/2017 1140  HCT 40.0 12/16/2017 1140   PLT 218 12/16/2017 1140   MCV 95 12/16/2017 1140   MCH 31.0 12/16/2017 1140   MCH 30.3 02/07/2017 0236   MCHC 32.8 12/16/2017 1140   MCHC 32.7 02/07/2017 0236   RDW 13.6 12/16/2017 1140    Chest Imaging: CXR 2018 unremarkable  MBS 10/01/22: Pt demonstrates normal swallow function. There are instances of flash penetration with thin liquids, WNL for age. Pt does not aspirate or show any signs of neuromuscular weakness impacting swallow. He does have appearance of bony protrusion on cervical spine impacting bolus flow at C5/6, though there is no residue or backflow on this study. No diet modification needed.   CT Chest 10/20/22 - perhaps subtle emphysema and bronchial wall thickening  Pulmonary Functions Testing Results:    Latest Ref Rng & Units 11/09/2022    2:17 PM  PFT Results  FVC-Pre L 2.49  P  FVC-Predicted Pre % 60  P  FVC-Post L 2.78  P  FVC-Predicted Post % 67  P  Pre FEV1/FVC % % 62  P  Post FEV1/FCV % % 62  P  FEV1-Pre L 1.55  P  FEV1-Predicted Pre % 52  P  FEV1-Post L 1.72  P  DLCO uncorrected ml/min/mmHg 18.09  P  DLCO UNC% % 73  P  DLCO corrected ml/min/mmHg 18.09  P  DLCO COR %Predicted % 73  P  DLVA Predicted % 107  P  TLC L 6.42  P  TLC % Predicted % 93   P  RV % Predicted % 155  P    P Preliminary result   PFT 11/09/22: Moderate obstruction, air trapping, borderline bronchodilator response  Echocardiogram 09/18/21:   1. Left ventricular ejection fraction, by estimation, is 40%%. The left  ventricle has normal function. The left ventricle has no regional wall  motion abnormalities. Left ventricular diastolic parameters are consistent  with Grade II diastolic dysfunction   (pseudonormalization). Elevated left atrial pressure.   2. Right ventricular systolic function is normal. The right ventricular  size is normal. There is normal pulmonary artery systolic pressure.   3. Left atrial size was moderately dilated.   4. Right atrial size was mild to moderately dilated.   5. The mitral valve is normal in structure. Mild mitral valve  regurgitation. No evidence of mitral stenosis.   6. The aortic valve is tricuspid. Aortic valve regurgitation is not  visualized. Aortic valve sclerosis is present, with no evidence of aortic  valve stenosis.   7. There is moderate dilatation of the aortic root, measuring 39 mm.   8. The inferior vena cava is normal in size with greater than 50%  respiratory variability, suggesting right atrial pressure of 3 mmHg.    NM stress 03/22/22:   Findings are consistent with prior myocardial infarction. The study is high risk.   No ST deviation was noted.   LV perfusion is abnormal. Defect 1: There is a large defect with severe reduction in uptake present in the apical to mid anterior location(s) that is fixed. There is abnormal wall motion in the defect area. Consistent with infarction. Defect 2: There is a large defect with moderate reduction in uptake present in the apical to basal inferolateral location(s) that is fixed.   Left ventricular function is abnormal. Global function is moderately reduced. There were multiple regional abnormalities. Nuclear stress EF: 38 %. The left ventricular ejection fraction is moderately  decreased (30-44%). End diastolic cavity size is moderately enlarged. End systolic  cavity size is moderately enlarged.   Findings consistent with multivessel disease and ischemia cardiomyopathy Large fixed antero apical infarct and large fixed inferior lateral infarct from apex to base no ischemia Estimated EF 38% with corresponding RWMA;s       Assessment & Plan:   # DOE # Asthma-COPD overlap # Borderline oxygen saturation While CAD/ICM could be playing role it does appear he has likely ACOS with borderline bronchodilator response even while adherent to stiolto. Borderline hypoxia seems unlikely related to his very limited extent of emphysema - there is possibility of PFO.   # Chronic cough Components of likely PND/UACS, irritable larynx/LPR and COPD or ACOS.   Plan: - STOP stiolto - START breztri 2 puff twice daily EVERYDAY with spacer. Rinse mouth and brush tongue/teeth after each use, rinse spacer daily - can use albuterol 1-2 puffs ONLY as needed - this is a rescue inhaler - take protonix (pantoprazole) 40 mg daily 30 minutes BEFORE breakfast   - take shower, clear nose of crusting then flonase 1 spray each nostril daily till next visit - cardiology may order limited tte with bubble - see you in 3 months or sooner if need be     Maryjane Hurter, MD [facility] 11/13/2022 4:28 PM

## 2022-11-13 ENCOUNTER — Ambulatory Visit: Payer: Medicare Other | Admitting: Student

## 2022-11-13 ENCOUNTER — Encounter: Payer: Self-pay | Admitting: Student

## 2022-11-13 VITALS — BP 114/62 | HR 56 | Temp 97.5°F | Ht 69.0 in | Wt 173.4 lb

## 2022-11-13 DIAGNOSIS — J4489 Other specified chronic obstructive pulmonary disease: Secondary | ICD-10-CM | POA: Diagnosis not present

## 2022-11-13 DIAGNOSIS — R053 Chronic cough: Secondary | ICD-10-CM

## 2022-11-13 MED ORDER — ALBUTEROL SULFATE HFA 108 (90 BASE) MCG/ACT IN AERS: 2.0000 | INHALATION_SPRAY | Freq: Four times a day (QID) | RESPIRATORY_TRACT | 11 refills | Status: DC | PRN

## 2022-11-13 MED ORDER — FLUTICASONE PROPIONATE 50 MCG/ACT NA SUSP: 1.0000 | Freq: Every day | NASAL | 11 refills | Status: AC

## 2022-11-13 MED ORDER — AEROCHAMBER MV MISC: 0 refills | Status: DC

## 2022-11-13 MED ORDER — BREZTRI AEROSPHERE 160-9-4.8 MCG/ACT IN AERO: 2.0000 | INHALATION_SPRAY | Freq: Two times a day (BID) | RESPIRATORY_TRACT | 0 refills | Status: AC

## 2022-11-13 MED ORDER — AEROCHAMBER MV MISC: 0 refills | Status: AC

## 2022-11-13 MED ORDER — BREZTRI AEROSPHERE 160-9-4.8 MCG/ACT IN AERO: 2.0000 | INHALATION_SPRAY | Freq: Two times a day (BID) | RESPIRATORY_TRACT | 11 refills | Status: DC

## 2022-11-13 NOTE — Patient Instructions (Addendum)
-   STOP stiolto - START breztri 2 puff twice daily EVERYDAY with spacer. Rinse mouth and brush tongue/teeth after each use, rinse spacer daily - can use albuterol 1-2 puffs ONLY as needed - this is a rescue inhaler - take protonix (pantoprazole) 40 mg daily 30 minutes BEFORE breakfast   - take shower, clear nose of crusting then flonase 1 spray each nostril daily till next visit - see you in 3 months or sooner if need be

## 2022-11-13 NOTE — Addendum Note (Signed)
Addended by: Fran Lowes on: 11/13/2022 04:36 PM   Modules accepted: Orders

## 2022-11-21 ENCOUNTER — Other Ambulatory Visit: Payer: Self-pay | Admitting: Cardiology

## 2023-01-02 ENCOUNTER — Other Ambulatory Visit: Payer: Self-pay | Admitting: Cardiology

## 2023-01-09 ENCOUNTER — Ambulatory Visit: Payer: Medicare Other | Admitting: Cardiology

## 2023-01-16 ENCOUNTER — Encounter: Payer: Self-pay | Admitting: Cardiology

## 2023-01-16 ENCOUNTER — Ambulatory Visit: Payer: Medicare Other | Attending: Cardiology | Admitting: Cardiology

## 2023-01-16 VITALS — BP 102/60 | HR 74 | Ht 69.0 in | Wt 169.0 lb

## 2023-01-16 DIAGNOSIS — E7800 Pure hypercholesterolemia, unspecified: Secondary | ICD-10-CM

## 2023-01-16 DIAGNOSIS — I2510 Atherosclerotic heart disease of native coronary artery without angina pectoris: Secondary | ICD-10-CM | POA: Diagnosis not present

## 2023-01-16 DIAGNOSIS — R0609 Other forms of dyspnea: Secondary | ICD-10-CM | POA: Diagnosis not present

## 2023-01-16 DIAGNOSIS — I10 Essential (primary) hypertension: Secondary | ICD-10-CM

## 2023-01-16 DIAGNOSIS — E785 Hyperlipidemia, unspecified: Secondary | ICD-10-CM

## 2023-01-16 DIAGNOSIS — I255 Ischemic cardiomyopathy: Secondary | ICD-10-CM

## 2023-01-16 NOTE — Patient Instructions (Addendum)
Medication Instructions:  Your physician recommends that you continue on your current medications as directed. Please refer to the Current Medication list given to you today.  *If you need a refill on your cardiac medications before your next appointment, please call your pharmacy*   Lab Work: 3rd Floor  [address] Your physician recommends that you return for lab work in: when fasting You need to have labs done when you are fasting.  You can come Monday through Friday 8:00 am to 11:30 am and 1:00 to 4:00. You do not need to make an appointment as the order has already been placed.   Testing/Procedures: Your physician has requested that you have an echocardiogram. Echocardiography is a painless test that uses sound waves to create images of your heart. It provides your doctor with information about the size and shape of your heart and how well your heart's chambers and valves are working. This procedure takes approximately one hour. There are no restrictions for this procedure. Please do NOT wear cologne, perfume, aftershave, or lotions (deodorant is allowed). Please arrive 15 minutes prior to your appointment time.    Follow-Up: At [facility], you and your health needs are our priority.  As part of our continuing mission to provide you with exceptional heart care, we have created designated Provider Care Teams.  These Care Teams include your primary Cardiologist (physician) and Advanced Practice Providers (APPs -  Physician Assistants and Nurse Practitioners) who all work together to provide you with the care you need, when you need it.  We recommend signing up for the patient portal called "MyChart".  Sign up information is provided on this After Visit Summary.  MyChart is used to connect with patients for Virtual Visits (Telemedicine).  Patients are able to view lab/test results, encounter notes, upcoming appointments, etc.  Non-urgent messages can be sent to your provider as well.   To  learn more about what you can do with MyChart, go to [email or web address].    Your next appointment:   6 month(s)  The format for your next appointment:   In Person  Provider:   Gypsy Balsam, MD    Other Instructions NA

## 2023-01-16 NOTE — Progress Notes (Signed)
Cardiology Office Note:    Date:  01/16/2023   ID:  Joseph Guerra, DOB 11-29-1945, MRN [id number]  PCP:  Andreas Blower., MD  Cardiologist:  Gypsy Balsam, MD    Referring MD: Andreas Blower., MD   Chief Complaint  Patient presents with   Follow-up  Doing better but problem is with voice and dysarthria  History of Present Illness:    Joseph Guerra is a 77 y.o. male  e past medical history significant for coronary artery disease, status post myocardial infarction 2001 as well as myocardial infarction June 2019 that required stenting to LAD as well as multiple stent to right coronary artery.  He does have history of cardiomyopathy with ejection fraction 30% however last estimation of ejection fraction was 40%.  Comes today to months for follow-up.  Again tragedy strikes the loss the daughter who is 24 years old and still dealing with this still grieving after that.  He describes weakness fatigue tiredness some shortness of breath with walking.  But no chest pain tightness squeezing pressure burning chest  Past Medical History:  Diagnosis Date   Arthritis associated with another disorder 12/17/2011   Atherosclerotic heart disease of native coronary artery without angina pectoris 12/17/2011   Formatting of this note might be different from the original. Myocardial infarction 2002 with apical hypokinesis Formatting of this note might be different from the original. Managed by Dr. Bing Matter   CAD (coronary artery disease)    a. prior MI in 2001 with stents placed at that time (patient unsure of which arteries)   Chronic dermatitis 10/24/2015   Collagenous colitis 10/16/2021   Coronary artery disease 12/17/2011   Overview:  Myocardial infarction 2002 with apical hypokinesis   Dyslipidemia (high LDL; low HDL) 06/18/2016   Essential hypertension 10/24/2015   Hypercholesteremia    Hyperlipidemia LDL goal <70    Hypertension    Hypokalemia    Ischemic cardiomyopathy    Ejection  fraction 30% in June 2018   Knee pain, left 05/07/2016   PVC's (premature ventricular contractions) 03/28/2017   ST elevation myocardial infarction (STEMI) ([facility])    ST elevation myocardial infarction (STEMI) of anterolateral wall ([facility]) 02/06/2017    Past Surgical History:  Procedure Laterality Date   CORONARY ANGIOPLASTY WITH STENT PLACEMENT     CORONARY BALLOON ANGIOPLASTY N/A 02/06/2017   Procedure: Coronary Balloon Angioplasty;  Surgeon: Iran Ouch, MD;  Location: [facility];  Service: Cardiovascular;  Laterality: N/A;   LEFT HEART CATH AND CORONARY ANGIOGRAPHY N/A 02/06/2017   Procedure: Left Heart Cath and Coronary Angiography;  Surgeon: Iran Ouch, MD;  Location: [facility];  Service: Cardiovascular;  Laterality: N/A;    Current Medications: Current Meds  Medication Sig   aspirin 81 MG chewable tablet Chew 1 tablet (81 mg total) by mouth daily.   atorvastatin (LIPITOR) 80 MG tablet Take 80 mg by mouth at bedtime.   Budeson-Glycopyrrol-Formoterol (BREZTRI AEROSPHERE) 160-9-4.8 MCG/ACT AERO Inhale 2 puffs into the lungs in the morning and at bedtime.   carvedilol (COREG) 12.5 MG tablet Take 1 tablet (12.5 mg total) by mouth 2 (two) times daily with a meal.   clobetasol cream (TEMOVATE) 0.05 % Apply 1 application topically 2 (two) times daily.   fluticasone (FLONASE) 50 MCG/ACT nasal spray Place 1 spray into both nostrils daily.   folic acid (FOLVITE) 400 MCG tablet Take 400 mcg by mouth daily.   isosorbide mononitrate (IMDUR) 30 MG 24 hr tablet Take 1 tablet  by mouth once daily (Patient taking differently: Take 30 mg by mouth daily.)   losartan (COZAAR) 25 MG tablet Take 1 tablet by mouth once daily (Patient taking differently: Take 25 mg by mouth daily.)   pantoprazole (PROTONIX) 40 MG tablet Take 1 tablet by mouth once daily   Spacer/Aero-Holding Chambers (AEROCHAMBER MV) inhaler Use as instructed (Patient taking differently: 1 each by Other route See admin  instructions. Use as instructed)   spironolactone (ALDACTONE) 25 MG tablet Take 0.5 tablets (12.5 mg total) by mouth 2 (two) times daily.   [DISCONTINUED] albuterol (VENTOLIN HFA) 108 (90 Base) MCG/ACT inhaler Inhale 2 puffs into the lungs every 6 (six) hours as needed for wheezing or shortness of breath.   [DISCONTINUED] Budeson-Glycopyrrol-Formoterol (BREZTRI AEROSPHERE) 160-9-4.8 MCG/ACT AERO Inhale 2 puffs into the lungs in the morning and at bedtime.     Allergies:   Patient has no known allergies.   Social History   Socioeconomic History   Marital status: Married    Spouse name: Not on file   Number of children: Not on file   Years of education: Not on file   Highest education level: Not on file  Occupational History   Not on file  Tobacco Use   Smoking status: Former   Smokeless tobacco: Never  Vaping Use   Vaping Use: Never used  Substance and Sexual Activity   Alcohol use: Yes    Comment: occasional. Glass of wine once in awhile   Drug use: No   Sexual activity: Not on file  Other Topics Concern   Not on file  Social History Narrative   Right Handed    Lives in a one story home with his wife.    Social Determinants of Health   Financial Resource Strain: Not on file  Food Insecurity: Not on file  Transportation Needs: Not on file  Physical Activity: Not on file  Stress: Not on file  Social Connections: Not on file     Family History: The patient's family history includes Heart disease in his father. ROS:   Please see the history of present illness.    All 14 point review of systems negative except as described per history of present illness  EKGs/Labs/Other Studies Reviewed:      Recent Labs: 08/29/2022: NT-Pro BNP 188; TSH 1.550  Recent Lipid Panel    Component Value Date/Time   CHOL 127 07/28/2021 0855   TRIG 71 07/28/2021 0855   HDL 54 07/28/2021 0855   CHOLHDL 2.4 07/28/2021 0855   CHOLHDL 3.0 02/07/2017 0236   VLDL 15 02/07/2017 0236    LDLCALC 59 07/28/2021 0855    Physical Exam:    VS:  BP 102/60 (BP Location: Left Arm)   Pulse 74   Ht 5\' 9"  (1.753 m)   Wt 169 lb (76.7 kg)   SpO2 92%   BMI 24.96 kg/m     Wt Readings from Last 3 Encounters:  01/16/23 169 lb (76.7 kg)  11/13/22 173 lb 6.4 oz (78.7 kg)  10/03/22 178 lb (80.7 kg)     GEN:  Well nourished, well developed in no acute distress HEENT: Normal NECK: No JVD; No carotid bruits LYMPHATICS: No lymphadenopathy CARDIAC: RRR, no murmurs, no rubs, no gallops RESPIRATORY:  Clear to auscultation without rales, wheezing or rhonchi  ABDOMEN: Soft, non-tender, non-distended MUSCULOSKELETAL:  No edema; No deformity  SKIN: Warm and dry LOWER EXTREMITIES: no swelling NEUROLOGIC:  Alert and oriented x 3 PSYCHIATRIC:  Normal affect  ASSESSMENT:    1. Hypercholesteremia   2. Dyspnea on exertion   3. Ischemic cardiomyopathy   4. Coronary artery disease involving native coronary artery of native heart without angina pectoris   5. Essential hypertension   6. Hyperlipidemia LDL goal <70    PLAN:    In order of problems listed above:  Hypercholesterolemia, will make arrangements for fasting lipid profile to be done. Dyspnea on exertion echocardiogram will be done to assess left ventricle ejection fraction. Coronary artery disease seems to be stable from that point reviewed no chest pain tightness squeezing pressure burning chest. Dyslipidemia I did review K PN which show me his LDL of 59 HDL 54 however this is from December 2022.  Cholesterol will be rechecked again   Medication Adjustments/Labs and Tests Ordered: Current medicines are reviewed at length with the patient today.  Concerns regarding medicines are outlined above.  Orders Placed This Encounter  Procedures   Lipid panel   ECHOCARDIOGRAM COMPLETE   Medication changes: No orders of the defined types were placed in this encounter.   Signed, Georgeanna Lea, MD, [facility] 01/16/2023 12:13 PM     [facility]

## 2023-02-07 ENCOUNTER — Ambulatory Visit (HOSPITAL_BASED_OUTPATIENT_CLINIC_OR_DEPARTMENT_OTHER)
Admission: RE | Admit: 2023-02-07 | Discharge: 2023-02-07 | Disposition: A | Discharge status: Home / Self Care | Payer: Medicare Other | Source: Ambulatory Visit | Attending: Cardiology | Admitting: Cardiology

## 2023-02-07 DIAGNOSIS — R0609 Other forms of dyspnea: Secondary | ICD-10-CM | POA: Diagnosis present

## 2023-02-07 LAB — ECHOCARDIOGRAM COMPLETE
AR max vel: 2.06 cm2
AV Area VTI: 2.09 cm2
AV Area mean vel: 2.17 cm2
AV Mean grad: 3 mmHg
AV Peak grad: 7.2 mmHg
Ao pk vel: 1.34 m/s
Area-P 1/2: 3.51 cm2
Calc EF: 35.3 %
S' Lateral: 4.2 cm
Single Plane A2C EF: 43.1 %
Single Plane A4C EF: 23 %

## 2023-02-07 MED ORDER — PERFLUTREN LIPID MICROSPHERE
1.0000 mL | INTRAVENOUS | Status: AC | PRN
  Administered 2023-02-07: 4 mL via INTRAVENOUS

## 2023-02-08 ENCOUNTER — Telehealth: Payer: Self-pay

## 2023-02-08 LAB — LIPID PANEL
Chol/HDL Ratio: 1.8 ratio (ref 0.0–5.0)
Cholesterol, Total: 126 mg/dL (ref 100–199)
HDL: 69 mg/dL (ref >39)
LDL Chol Calc (NIH): 44 mg/dL (ref 0–99)
Triglycerides: 62 mg/dL (ref 0–149)
VLDL Cholesterol Cal: 13 mg/dL (ref 5–40)

## 2023-02-08 NOTE — Telephone Encounter (Signed)
Left message on My Chart with normal results per Dr. Krasowski's note. Routed to PCP. 

## 2023-02-15 ENCOUNTER — Telehealth: Payer: Self-pay

## 2023-02-15 NOTE — Telephone Encounter (Signed)
-----   Message from Georgeanna Lea, MD sent at 02/14/2023  8:52 AM EDT ----- Cholesterol perfect, continue present management

## 2023-02-15 NOTE — Telephone Encounter (Signed)
Hello, per Dr. K said the following about your results:  

## 2023-02-17 NOTE — Progress Notes (Signed)
Synopsis: Referred for DOE by Andreas Blower., MD  Subjective:   PATIENT ID: Joseph Guerra GENDER: male DOB: 01-20-1946, MRN: [id number]  Chief Complaint  Patient presents with   Follow-up    Pt speech has not improved, no other c/o    CC: cough  76yM with history of STEMI, ICM followed by Dr. Bing Matter, HTN, referred for DOE  Trouble breathing with exertion over a year ago, worsening especially over last several months. Doesn't usually have a cough but most mornings however he does cough up some phlegm. Later says he does have a cough productive of phlegm through much of the day. He has never tried an inhaler.   They have also noticed change in speech for the last few months. Speech just isn't as clear. Some choking on food/liquid over the last few months.   Smoked 20 py or so quit 20 ya. Worked in [employer], Ambulance person.   Interval HPI  Doing much better since starting breztri. Very rarely uses albuterol. Cough is far less productive and only coughs up phlegm in morning.   Otherwise pertinent review of systems is negative.  Past Medical History:  Diagnosis Date   Arthritis associated with another disorder 12/17/2011   Atherosclerotic heart disease of native coronary artery without angina pectoris 12/17/2011   Formatting of this note might be different from the original. Myocardial infarction 2002 with apical hypokinesis Formatting of this note might be different from the original. Managed by Dr. Bing Matter   CAD (coronary artery disease)    a. prior MI in 2001 with stents placed at that time (patient unsure of which arteries)   Chronic dermatitis 10/24/2015   Collagenous colitis 10/16/2021   Coronary artery disease 12/17/2011   Overview:  Myocardial infarction 2002 with apical hypokinesis   Dyslipidemia (high LDL; low HDL) 06/18/2016   Essential hypertension 10/24/2015   Hypercholesteremia    Hyperlipidemia LDL goal <70    Hypertension    Hypokalemia    Ischemic  cardiomyopathy    Ejection fraction 30% in June 2018   Knee pain, left 05/07/2016   PVC's (premature ventricular contractions) 03/28/2017   ST elevation myocardial infarction (STEMI) (HCC)    ST elevation myocardial infarction (STEMI) of anterolateral wall (HCC) 02/06/2017     Family History  Problem Relation Age of Onset   Heart disease Father      Past Surgical History:  Procedure Laterality Date   CORONARY ANGIOPLASTY WITH STENT PLACEMENT     CORONARY BALLOON ANGIOPLASTY N/A 02/06/2017   Procedure: Coronary Balloon Angioplasty;  Surgeon: Iran Ouch, MD;  Location: [facility] INVASIVE CV LAB;  Service: Cardiovascular;  Laterality: N/A;   LEFT HEART CATH AND CORONARY ANGIOGRAPHY N/A 02/06/2017   Procedure: Left Heart Cath and Coronary Angiography;  Surgeon: Iran Ouch, MD;  Location: [facility] INVASIVE CV LAB;  Service: Cardiovascular;  Laterality: N/A;    Social History   Socioeconomic History   Marital status: Married    Spouse name: Not on file   Number of children: Not on file   Years of education: Not on file   Highest education level: Not on file  Occupational History   Not on file  Tobacco Use   Smoking status: Former   Smokeless tobacco: Never  Vaping Use   Vaping Use: Never used  Substance and Sexual Activity   Alcohol use: Yes    Comment: occasional. Glass of wine once in awhile   Drug use: No   Sexual activity: Not  on file  Other Topics Concern   Not on file  Social History Narrative   Right Handed    Lives in a one story home with his wife.    Social Determinants of Health   Financial Resource Strain: Not on file  Food Insecurity: Not on file  Transportation Needs: Not on file  Physical Activity: Not on file  Stress: Not on file  Social Connections: Not on file  Intimate Partner Violence: Not on file     No Known Allergies   Outpatient Medications Prior to Visit  Medication Sig Dispense Refill   aspirin 81 MG chewable tablet Chew 1 tablet (81 mg  total) by mouth daily. 30 tablet 11   atorvastatin (LIPITOR) 80 MG tablet Take 80 mg by mouth at bedtime.     Budeson-Glycopyrrol-Formoterol (BREZTRI AEROSPHERE) 160-9-4.8 MCG/ACT AERO Inhale 2 puffs into the lungs in the morning and at bedtime. 10.7 g 0   carvedilol (COREG) 12.5 MG tablet Take 1 tablet (12.5 mg total) by mouth 2 (two) times daily with a meal. 180 tablet 2   clobetasol cream (TEMOVATE) 0.05 % Apply 1 application topically 2 (two) times daily.     fluticasone (FLONASE) 50 MCG/ACT nasal spray Place 1 spray into both nostrils daily. 16 g 11   folic acid (FOLVITE) 400 MCG tablet Take 400 mcg by mouth daily.     isosorbide mononitrate (IMDUR) 30 MG 24 hr tablet Take 1 tablet by mouth once daily (Patient taking differently: Take 30 mg by mouth daily.) 90 tablet 2   losartan (COZAAR) 25 MG tablet Take 1 tablet by mouth once daily (Patient taking differently: Take 25 mg by mouth daily.) 90 tablet 1   pantoprazole (PROTONIX) 40 MG tablet Take 1 tablet by mouth once daily 90 tablet 0   Spacer/Aero-Holding Chambers (AEROCHAMBER MV) inhaler Use as instructed (Patient taking differently: 1 each by Other route See admin instructions. Use as instructed) 1 each 0   spironolactone (ALDACTONE) 25 MG tablet Take 0.5 tablets (12.5 mg total) by mouth 2 (two) times daily. 90 tablet 2   Facility-Administered Medications Prior to Visit  Medication Dose Route Frequency Provider Last Rate Last Admin   technetium tetrofosmin (TC-MYOVIEW) injection 30 millicurie  30 millicurie Intravenous Once PRN Wendall Stade, MD           Objective:   Physical Exam:  General appearance: 77 y.o., male, NAD, conversant  Eyes: anicteric sclerae; PERRL, tracking appropriately HENT: NCAT; MMM Neck: Trachea midline; no lymphadenopathy, no JVD Lungs: CTAB, no crackles, no wheeze, with normal respiratory effort CV: RRR, no murmur  Abdomen: Soft, non-tender; non-distended, BS present  Extremities: No peripheral  edema, warm Skin: Normal turgor and texture; no rash Psych: Appropriate affect Neuro: Alert and oriented to person and place, no focal deficit     Vitals:   02/19/23 1548  BP: 102/62  Pulse: (!) 54  SpO2: 95%  Weight: 167 lb 9.6 oz (76 kg)  Height: 5\' 8"  (1.727 m)     95% on RA BMI Readings from Last 3 Encounters:  02/19/23 25.48 kg/m  01/16/23 24.96 kg/m  11/13/22 25.61 kg/m   Wt Readings from Last 3 Encounters:  02/19/23 167 lb 9.6 oz (76 kg)  01/16/23 169 lb (76.7 kg)  11/13/22 173 lb 6.4 oz (78.7 kg)     CBC    Component Value Date/Time   WBC 5.5 12/16/2017 1140   WBC 8.5 02/07/2017 0236   RBC 4.23 12/16/2017 1140  RBC 4.19 (L) 02/07/2017 0236   HGB 13.1 12/16/2017 1140   HCT 40.0 12/16/2017 1140   PLT 218 12/16/2017 1140   MCV 95 12/16/2017 1140   MCH 31.0 12/16/2017 1140   MCH 30.3 02/07/2017 0236   MCHC 32.8 12/16/2017 1140   MCHC 32.7 02/07/2017 0236   RDW 13.6 12/16/2017 1140    Chest Imaging: CXR 2018 unremarkable  MBS 10/01/22: Pt demonstrates normal swallow function. There are instances of flash penetration with thin liquids, WNL for age. Pt does not aspirate or show any signs of neuromuscular weakness impacting swallow. He does have appearance of bony protrusion on cervical spine impacting bolus flow at C5/6, though there is no residue or backflow on this study. No diet modification needed.   CT Chest 10/20/22 - perhaps subtle emphysema and bronchial wall thickening  Pulmonary Functions Testing Results:    Latest Ref Rng & Units 11/09/2022    2:17 PM  PFT Results  FVC-Pre L 2.49   FVC-Predicted Pre % 60   FVC-Post L 2.78   FVC-Predicted Post % 67   Pre FEV1/FVC % % 62   Post FEV1/FCV % % 62   FEV1-Pre L 1.55   FEV1-Predicted Pre % 52   FEV1-Post L 1.72   DLCO uncorrected ml/min/mmHg 18.09   DLCO UNC% % 73   DLCO corrected ml/min/mmHg 18.09   DLCO COR %Predicted % 73   DLVA Predicted % 107   TLC L 6.42   TLC % Predicted % 93    RV % Predicted % 155    PFT 11/09/22: Moderate obstruction, air trapping, borderline bronchodilator response  Echocardiogram 09/18/21:   1. Left ventricular ejection fraction, by estimation, is 40%%. The left  ventricle has normal function. The left ventricle has no regional wall  motion abnormalities. Left ventricular diastolic parameters are consistent  with Grade II diastolic dysfunction   (pseudonormalization). Elevated left atrial pressure.   2. Right ventricular systolic function is normal. The right ventricular  size is normal. There is normal pulmonary artery systolic pressure.   3. Left atrial size was moderately dilated.   4. Right atrial size was mild to moderately dilated.   5. The mitral valve is normal in structure. Mild mitral valve  regurgitation. No evidence of mitral stenosis.   6. The aortic valve is tricuspid. Aortic valve regurgitation is not  visualized. Aortic valve sclerosis is present, with no evidence of aortic  valve stenosis.   7. There is moderate dilatation of the aortic root, measuring 39 mm.   8. The inferior vena cava is normal in size with greater than 50%  respiratory variability, suggesting right atrial pressure of 3 mmHg.    NM stress 03/22/22:   Findings are consistent with prior myocardial infarction. The study is high risk.   No ST deviation was noted.   LV perfusion is abnormal. Defect 1: There is a large defect with severe reduction in uptake present in the apical to mid anterior location(s) that is fixed. There is abnormal wall motion in the defect area. Consistent with infarction. Defect 2: There is a large defect with moderate reduction in uptake present in the apical to basal inferolateral location(s) that is fixed.   Left ventricular function is abnormal. Global function is moderately reduced. There were multiple regional abnormalities. Nuclear stress EF: 38 %. The left ventricular ejection fraction is moderately decreased (30-44%). End  diastolic cavity size is moderately enlarged. End systolic cavity size is moderately enlarged.   Findings consistent with  multivessel disease and ischemia cardiomyopathy Large fixed antero apical infarct and large fixed inferior lateral infarct from apex to base no ischemia Estimated EF 38% with corresponding RWMA;s       Assessment & Plan:   # DOE # Asthma-COPD overlap # Borderline oxygen saturation While CAD/ICM could be playing role it does appear he has likely ACOS with borderline bronchodilator response even while adherent to stiolto - has responded well symptomatically to breztri. Borderline hypoxia seems unlikely related to his very limited extent of emphysema - there is possibility of PFO.   # Chronic cough Components of likely PND/UACS, irritable larynx/LPR and COPD or ACOS.   Plan: - breztri 2 puff twice daily EVERYDAY with spacer. Rinse mouth and brush tongue/teeth after each use, rinse spacer daily - can use albuterol 1-2 puffs ONLY as needed - this is a rescue inhaler - would try to stop protonix - if cough gets worse then resume it - would stay on flonase  - referral made to ENT today - pt/daughter request referral for assessment of dysphonia  - see you in 3 months or sooner if need be     Omar Person, MD [facility] 02/19/2023 4:15 PM

## 2023-02-19 ENCOUNTER — Ambulatory Visit: Payer: Medicare Other | Admitting: Student

## 2023-02-19 ENCOUNTER — Encounter: Payer: Self-pay | Admitting: Student

## 2023-02-19 VITALS — BP 102/62 | HR 54 | Ht 68.0 in | Wt 167.6 lb

## 2023-02-19 DIAGNOSIS — R490 Dysphonia: Secondary | ICD-10-CM

## 2023-02-19 DIAGNOSIS — J4489 Other specified chronic obstructive pulmonary disease: Secondary | ICD-10-CM

## 2023-02-19 NOTE — Patient Instructions (Addendum)
-   breztri 2 puff twice daily EVERYDAY with spacer. Rinse mouth and brush tongue/teeth after each use, rinse spacer daily - can use albuterol 1-2 puffs ONLY as needed - this is a rescue inhaler - would try to stop protonix - if cough gets worse then resume it - would stay on flonase  - referral made to ENT today  - see you in 3 months or sooner if need be

## 2023-03-09 ENCOUNTER — Other Ambulatory Visit: Payer: Self-pay | Admitting: Cardiology

## 2023-03-13 ENCOUNTER — Other Ambulatory Visit: Payer: Self-pay | Admitting: Cardiology

## 2023-03-13 DIAGNOSIS — I2510 Atherosclerotic heart disease of native coronary artery without angina pectoris: Secondary | ICD-10-CM

## 2023-03-13 DIAGNOSIS — I10 Essential (primary) hypertension: Secondary | ICD-10-CM

## 2023-04-14 ENCOUNTER — Other Ambulatory Visit: Payer: Self-pay | Admitting: Cardiology

## 2023-04-14 DIAGNOSIS — I2510 Atherosclerotic heart disease of native coronary artery without angina pectoris: Secondary | ICD-10-CM

## 2023-04-14 DIAGNOSIS — I10 Essential (primary) hypertension: Secondary | ICD-10-CM

## 2023-05-03 DIAGNOSIS — R490 Dysphonia: Secondary | ICD-10-CM | POA: Insufficient documentation

## 2023-05-24 DIAGNOSIS — I6521 Occlusion and stenosis of right carotid artery: Secondary | ICD-10-CM | POA: Insufficient documentation

## 2023-06-05 DIAGNOSIS — I4891 Unspecified atrial fibrillation: Secondary | ICD-10-CM | POA: Insufficient documentation

## 2023-06-05 DIAGNOSIS — J449 Chronic obstructive pulmonary disease, unspecified: Secondary | ICD-10-CM | POA: Insufficient documentation

## 2023-06-08 DIAGNOSIS — R531 Weakness: Secondary | ICD-10-CM | POA: Insufficient documentation

## 2023-06-08 DIAGNOSIS — J9601 Acute respiratory failure with hypoxia: Secondary | ICD-10-CM | POA: Insufficient documentation

## 2023-06-17 ENCOUNTER — Ambulatory Visit: Payer: Medicare Other | Attending: Cardiology | Admitting: Cardiology

## 2023-06-17 VITALS — BP 112/54 | HR 69 | Ht 70.0 in | Wt 172.4 lb

## 2023-06-17 DIAGNOSIS — I255 Ischemic cardiomyopathy: Secondary | ICD-10-CM

## 2023-06-17 DIAGNOSIS — I2510 Atherosclerotic heart disease of native coronary artery without angina pectoris: Secondary | ICD-10-CM | POA: Diagnosis not present

## 2023-06-17 DIAGNOSIS — I4819 Other persistent atrial fibrillation: Secondary | ICD-10-CM | POA: Diagnosis not present

## 2023-06-17 DIAGNOSIS — I10 Essential (primary) hypertension: Secondary | ICD-10-CM | POA: Diagnosis not present

## 2023-06-17 DIAGNOSIS — E785 Hyperlipidemia, unspecified: Secondary | ICD-10-CM

## 2023-06-17 DIAGNOSIS — R0609 Other forms of dyspnea: Secondary | ICD-10-CM

## 2023-06-17 DIAGNOSIS — I6521 Occlusion and stenosis of right carotid artery: Secondary | ICD-10-CM | POA: Diagnosis not present

## 2023-06-17 NOTE — Patient Instructions (Addendum)
Medication Instructions:  Your physician recommends that you continue on your current medications as directed. Please refer to the Current Medication list given to you today.  *If you need a refill on your cardiac medications before your next appointment, please call your pharmacy*   Lab Work: BMP, ProBNP- today If you have labs (blood work) drawn today and your tests are completely normal, you will receive your results only by: MyChart Message (if you have MyChart) OR A paper copy in the mail If you have any lab test that is abnormal or we need to change your treatment, we will call you to review the results.   Testing/Procedures: None Ordered   Follow-Up: At [facility], you and your health needs are our priority.  As part of our continuing mission to provide you with exceptional heart care, we have created designated Provider Care Teams.  These Care Teams include your primary Cardiologist (physician) and Advanced Practice Providers (APPs -  Physician Assistants and Nurse Practitioners) who all work together to provide you with the care you need, when you need it.  We recommend signing up for the patient portal called "MyChart".  Sign up information is provided on this After Visit Summary.  MyChart is used to connect with patients for Virtual Visits (Telemedicine).  Patients are able to view lab/test results, encounter notes, upcoming appointments, etc.  Non-urgent messages can be sent to your provider as well.   To learn more about what you can do with MyChart, go to [email or web address].    Your next appointment:   3-4  week(s) in [facility]  The format for your next appointment:   In Person  Provider:   Gypsy Balsam, MD    Other Instructions NA

## 2023-06-17 NOTE — Progress Notes (Unsigned)
Cardiology Office Note:    Date:  06/17/2023   ID:  Joseph Guerra, DOB Sep 20, 1945, MRN [id number]  PCP:  Andreas Blower., MD  Cardiologist:  Gypsy Balsam, MD    Referring MD: Andreas Blower., MD   Chief Complaint  Patient presents with   Hospitalization Follow-up    History of Present Illness:    Joseph Guerra is a 76 y.o. male with past medical history significant for coronary artery disease, status post microinfarction 2001 as well as myocardial infarction in June 2019.  That required stent to LAD as well as multiple stent to the right coronary.  He does have history of cardiomyopathy with ejection fraction 30% before then with appropriate guideline directed medical therapy improved to 40%.  Recently he ended up being in the hospital because of hypercapnic hypoxic respiratory failure required intubation for 25 hours.  Thinking was that this is related to exacerbation of COPD as well as potential infectious process going on.  He was also noted to have some undetermined CVA in the right lobe of the cerebellum.  He did have CT angio of his neck which showed up to 80% stenosis on the right internal carotic artery however feeling worse that there was not enough to intervene.  Recently he ended up going to his primary care physician he was noted to be in atrial fibrillation with controlled ventricular rate.  Anticoagulation has been initiated, Plavix has been discontinued so he is on aspirin and Eliquis right now.  Doing well completely asymptomatic from atrial fibrillation point of view.  Still complain of being weak tired exhausted he is able to walk a little bit at home.  He does have changes in his voice.  He said he had some difficulty swallowing did have quite extensive evaluation apparently negative.  He does have history of smoking long time ago.  Denies have any chest pain tightness squeezing pressure burning chest no palpitations.  There is minimal swelling of lower  extremities  Past Medical History:  Diagnosis Date   Arthritis associated with another disorder 12/17/2011   Atherosclerotic heart disease of native coronary artery without angina pectoris 12/17/2011   Formatting of this note might be different from the original. Myocardial infarction 2002 with apical hypokinesis Formatting of this note might be different from the original. Managed by Dr. Bing Matter   CAD (coronary artery disease)    a. prior MI in 2001 with stents placed at that time (patient unsure of which arteries)   Chronic dermatitis 10/24/2015   Collagenous colitis 10/16/2021   Coronary artery disease 12/17/2011   Overview:  Myocardial infarction 2002 with apical hypokinesis   Dyslipidemia (high LDL; low HDL) 06/18/2016   Essential hypertension 10/24/2015   Hypercholesteremia    Hyperlipidemia LDL goal <70    Hypertension    Hypokalemia    Ischemic cardiomyopathy    Ejection fraction 30% in June 2018   Knee pain, left 05/07/2016   PVC's (premature ventricular contractions) 03/28/2017   ST elevation myocardial infarction (STEMI) (HCC)    ST elevation myocardial infarction (STEMI) of anterolateral wall (HCC) 02/06/2017    Past Surgical History:  Procedure Laterality Date   CORONARY ANGIOPLASTY WITH STENT PLACEMENT     CORONARY BALLOON ANGIOPLASTY N/A 02/06/2017   Procedure: Coronary Balloon Angioplasty;  Surgeon: Iran Ouch, MD;  Location: [facility] INVASIVE CV LAB;  Service: Cardiovascular;  Laterality: N/A;   LEFT HEART CATH AND CORONARY ANGIOGRAPHY N/A 02/06/2017   Procedure: Left Heart Cath and Coronary Angiography;  Surgeon: Iran Ouch, MD;  Location: [facility] INVASIVE CV LAB;  Service: Cardiovascular;  Laterality: N/A;    Current Medications: Current Meds  Medication Sig   amoxicillin-clavulanate (AUGMENTIN) 875-125 MG tablet Take 1 tablet by mouth 2 (two) times daily.   aspirin 81 MG chewable tablet Chew 1 tablet (81 mg total) by mouth daily.   atorvastatin (LIPITOR) 80 MG  tablet Take 80 mg by mouth at bedtime.   Budeson-Glycopyrrol-Formoterol (BREZTRI AEROSPHERE) 160-9-4.8 MCG/ACT AERO Inhale 2 puffs into the lungs in the morning and at bedtime.   carvedilol (COREG) 12.5 MG tablet Take 1 tablet (12.5 mg total) by mouth 2 (two) times daily with a meal.   clobetasol cream (TEMOVATE) 0.05 % Apply 1 application topically 2 (two) times daily.   clopidogrel (PLAVIX) 75 MG tablet Take 75 mg by mouth daily.   ELIQUIS 5 MG TABS tablet Take 5 mg by mouth 2 (two) times daily.   fluticasone (FLONASE) 50 MCG/ACT nasal spray Place 1 spray into both nostrils daily.   folic acid (FOLVITE) 400 MCG tablet Take 400 mcg by mouth daily.   ipratropium-albuterol (DUONEB) 0.5-2.5 (3) MG/3ML SOLN Inhale 3 mLs into the lungs every 6 (six) hours as needed (SOB).   isosorbide mononitrate (IMDUR) 30 MG 24 hr tablet Take 1 tablet by mouth once daily (Patient taking differently: Take 30 mg by mouth daily.)   levETIRAcetam (KEPPRA) 500 MG tablet Take 500 mg by mouth 2 (two) times daily.   pantoprazole (PROTONIX) 40 MG tablet Take 1 tablet by mouth once daily   Spacer/Aero-Holding Chambers (AEROCHAMBER MV) inhaler Use as instructed (Patient taking differently: 1 each by Other route See admin instructions. Use as instructed)   [DISCONTINUED] losartan (COZAAR) 25 MG tablet Take 1 tablet by mouth once daily (Patient taking differently: Take 25 mg by mouth daily.)   [DISCONTINUED] spironolactone (ALDACTONE) 25 MG tablet Take 1/2 (one-half) tablet by mouth twice daily (Patient taking differently: Take 12.5 mg by mouth 2 (two) times daily.)     Allergies:   Patient has no known allergies.   Social History   Socioeconomic History   Marital status: Married    Spouse name: Not on file   Number of children: Not on file   Years of education: Not on file   Highest education level: Not on file  Occupational History   Not on file  Tobacco Use   Smoking status: Former   Smokeless tobacco: Never   Vaping Use   Vaping status: Never Used  Substance and Sexual Activity   Alcohol use: Yes    Comment: occasional. Glass of wine once in awhile   Drug use: No   Sexual activity: Not on file  Other Topics Concern   Not on file  Social History Narrative   Right Handed    Lives in a one story home with his wife.    Social Determinants of Health   Financial Resource Strain: Not on file  Food Insecurity: Low Risk  (05/27/2023)   Received from [facility]   Hunger Vital Sign    Worried About Running Out of Food in the Last Year: Never true    Ran Out of Food in the Last Year: Never true  Transportation Needs: No Transportation Needs (05/27/2023)   Received from [facility]    In the past 12 months, has lack of reliable transportation kept you from medical appointments, meetings, work or from getting things needed for daily living? : No  Physical Activity: Not on file  Stress: Not on file  Social Connections: Not on file     Family History: The patient's family history includes Heart disease in his father. ROS:   Please see the history of present illness.    All 14 point review of systems negative except as described per history of present illness  EKGs/Labs/Other Studies Reviewed:    EKG Interpretation Date/Time:  Monday June 17 2023 11:04:52 EDT Ventricular Rate:  69 PR Interval:    QRS Duration:  92 QT Interval:  380 QTC Calculation: 407 R Axis:   94  Text Interpretation: Atrial fibrillation with premature ventricular or aberrantly conducted complexes Rightward axis Cannot rule out Anterior infarct (cited on or before 07-Feb-2017) Abnormal ECG When compared with ECG of 09-Feb-2017 06:08, Atrial fibrillation has replaced Sinus rhythm QRS voltage has decreased Questionable change in initial forces of Lateral leads ST no longer elevated in Anterior leads Confirmed by Gypsy Balsam [phone]) on 06/17/2023 11:10:27 AM    Recent Labs: 08/29/2022: NT-Pro  BNP 188; TSH 1.550  Recent Lipid Panel    Component Value Date/Time   CHOL 126 02/07/2023 0919   TRIG 62 02/07/2023 0919   HDL 69 02/07/2023 0919   CHOLHDL 1.8 02/07/2023 0919   CHOLHDL 3.0 02/07/2017 0236   VLDL 15 02/07/2017 0236   LDLCALC 44 02/07/2023 0919    Physical Exam:    VS:  BP (!) 112/54 (BP Location: Left Arm, Patient Position: Sitting)   Pulse 69   Ht 5\' 10"  (1.778 m)   Wt 172 lb 6.4 oz (78.2 kg)   SpO2 95%   BMI 24.74 kg/m     Wt Readings from Last 3 Encounters:  06/17/23 172 lb 6.4 oz (78.2 kg)  02/19/23 167 lb 9.6 oz (76 kg)  01/16/23 169 lb (76.7 kg)     GEN:  Well nourished, well developed in no acute distress HEENT: Normal NECK: No JVD; No carotid bruits LYMPHATICS: No lymphadenopathy CARDIAC: Irregular irregular, no murmurs, no rubs, no gallops RESPIRATORY:  Clear to auscultation without rales, wheezing or rhonchi  ABDOMEN: Soft, non-tender, non-distended MUSCULOSKELETAL:  No edema; No deformity  SKIN: Warm and dry LOWER EXTREMITIES: 1+ swelling NEUROLOGIC:  Alert and oriented x 3 PSYCHIATRIC:  Normal affect   ASSESSMENT:    1. Essential hypertension   2. Persistent atrial fibrillation (HCC)   3. Atherosclerosis of native coronary artery of native heart without angina pectoris   4. Carotid stenosis, asymptomatic, right   5. Ischemic cardiomyopathy   6. Dyslipidemia (high LDL; low HDL)    PLAN:    In order of problems listed above:  Atrial fibrillation is a new discovery.  Anticoagulated, his CHADS2 Vascor equals 5 will continue.  I bring him back to my office in about a month to see that the rhythm is still atrial fibrillation then will consider cardioversion. Essential hypertension: With dealing with opposite problem blood pressure being low and some of the medication has been withdrawn that includes Aldactone as well as losartan.  Will continue monitoring then hopefully will be able to restart some of those medications. Carotic artery  stenosis on the left side 60 to 80%.  Difficult to judge when CVA happened in his cerebellum.  He is being seeing vascular surgeons and the recommendation was to continue medical therapy.  He is on antiplatelet therapy as well as on statin.  However now knowing that he got atrial fibrillation I suspect he may have had paroxysmal atrial fibrillation which  were responsible for his CVA.  The key will be to anticoagulate him which is being achieved with Eliquis 5 mg twice daily I encouraged her to continue. History of ischemic cardiomyopathy with ejection fraction estimated 4045% with apical akinesis which an old problem.  He is on guideline directed medical therapy but because of blood pressure being low a lot of this medication has to be cut down.  Will continue monitoring I will check Chem-7 and proBNP today to see if there is any role for potential diuretic to help with the swelling of lower extremities. Overall is a very complex case I review to hospitalization for that visit.  It took me at least 45 minutes to do that.   Medication Adjustments/Labs and Tests Ordered: Current medicines are reviewed at length with the patient today.  Concerns regarding medicines are outlined above.  Orders Placed This Encounter  Procedures   EKG 12-Lead   Medication changes: No orders of the defined types were placed in this encounter.   Signed, Georgeanna Lea, MD, [facility] 06/17/2023 11:34 AM    [facility]

## 2023-06-18 LAB — BASIC METABOLIC PANEL
BUN/Creatinine Ratio: 15 (ref 10–24)
BUN: 11 mg/dL (ref 8–27)
CO2: 31 mmol/L — ABNORMAL HIGH (ref 20–29)
Calcium: 8.9 mg/dL (ref 8.6–10.2)
Chloride: 103 mmol/L (ref 96–106)
Creatinine, Ser: 0.72 mg/dL — ABNORMAL LOW (ref 0.76–1.27)
Glucose: 77 mg/dL (ref 70–99)
Potassium: 4.4 mmol/L (ref 3.5–5.2)
Sodium: 145 mmol/L — ABNORMAL HIGH (ref 134–144)
eGFR: 96 mL/min/{1.73_m2} (ref >59)

## 2023-06-18 LAB — PRO B NATRIURETIC PEPTIDE: NT-Pro BNP: 765 pg/mL — ABNORMAL HIGH (ref 0–376)

## 2023-06-19 ENCOUNTER — Telehealth: Payer: Self-pay | Admitting: Cardiology

## 2023-06-19 DIAGNOSIS — R0609 Other forms of dyspnea: Secondary | ICD-10-CM

## 2023-06-19 NOTE — Telephone Encounter (Signed)
Spoke with Joseph Guerra who states that when the pt was admitted to the hospital they stopped his fluid pill. BP at home is 136/80 and he has swelling in both ankles and his upper eyelids are swollen. Denies shortness of breath. Please advise

## 2023-06-19 NOTE — Telephone Encounter (Signed)
Pt's daughter is requesting a callback regarding him needing to be prescribed something for fluid release. Please advise

## 2023-06-20 ENCOUNTER — Other Ambulatory Visit: Payer: Self-pay | Admitting: Cardiology

## 2023-06-20 DIAGNOSIS — I10 Essential (primary) hypertension: Secondary | ICD-10-CM

## 2023-06-20 DIAGNOSIS — I2510 Atherosclerotic heart disease of native coronary artery without angina pectoris: Secondary | ICD-10-CM

## 2023-06-20 MED ORDER — FUROSEMIDE 20 MG PO TABS: 20.0000 mg | ORAL_TABLET | Freq: Every day | ORAL | 3 refills | Status: AC

## 2023-06-20 NOTE — Addendum Note (Signed)
Addended by: Eleonore Chiquito on: 06/20/2023 11:21 AM   Modules accepted: Orders

## 2023-06-20 NOTE — Telephone Encounter (Signed)
Recommendations reviewed with pt as per Dr. Krasowski's note.  Pt verbalized understanding and had no additional questions.  

## 2023-06-27 LAB — BASIC METABOLIC PANEL
BUN/Creatinine Ratio: 20 (ref 10–24)
BUN: 15 mg/dL (ref 8–27)
CO2: 30 mmol/L — ABNORMAL HIGH (ref 20–29)
Calcium: 9.4 mg/dL (ref 8.6–10.2)
Chloride: 100 mmol/L (ref 96–106)
Creatinine, Ser: 0.74 mg/dL — ABNORMAL LOW (ref 0.76–1.27)
Glucose: 86 mg/dL (ref 70–99)
Potassium: 4.8 mmol/L (ref 3.5–5.2)
Sodium: 143 mmol/L (ref 134–144)
eGFR: 95 mL/min/{1.73_m2} (ref >59)

## 2023-07-01 ENCOUNTER — Other Ambulatory Visit: Payer: Self-pay | Admitting: Cardiology

## 2023-07-02 ENCOUNTER — Telehealth: Payer: Self-pay

## 2023-07-02 NOTE — Telephone Encounter (Signed)
Left message on My Chart with normal results per Dr. Krasowski's note. Routed to PCP. 

## 2023-07-02 NOTE — Telephone Encounter (Signed)
-----   Message from Gypsy Balsam sent at 06/27/2023  9:38 AM EDT ----- Labs are noted, good, continue present management

## 2023-07-02 NOTE — Telephone Encounter (Signed)
Patient notified through my chart.

## 2023-07-04 ENCOUNTER — Telehealth: Payer: Self-pay

## 2023-07-04 NOTE — Telephone Encounter (Signed)
Pt viewed results on My Chart per Dr. Krasowski's note. Routed to PCP.  

## 2023-07-09 ENCOUNTER — Encounter: Payer: Self-pay | Admitting: Cardiology

## 2023-07-09 ENCOUNTER — Ambulatory Visit: Payer: Medicare Other | Attending: Cardiology | Admitting: Cardiology

## 2023-07-09 VITALS — BP 100/62 | HR 74 | Ht 69.0 in | Wt 162.0 lb

## 2023-07-09 DIAGNOSIS — I2510 Atherosclerotic heart disease of native coronary artery without angina pectoris: Secondary | ICD-10-CM

## 2023-07-09 DIAGNOSIS — I4819 Other persistent atrial fibrillation: Secondary | ICD-10-CM

## 2023-07-09 DIAGNOSIS — I255 Ischemic cardiomyopathy: Secondary | ICD-10-CM | POA: Diagnosis not present

## 2023-07-09 DIAGNOSIS — I6521 Occlusion and stenosis of right carotid artery: Secondary | ICD-10-CM | POA: Diagnosis not present

## 2023-07-09 NOTE — H&P (View-Only) (Signed)
 Cardiology Office Note:    Date:  07/09/2023   ID:  Joseph Guerra, DOB 26-Mar-1946, MRN [id number]  PCP:  Andreas Blower., MD  Cardiologist:  Gypsy Balsam, MD    Referring MD: Andreas Blower., MD   Chief Complaint  Patient presents with   Follow-up    History of Present Illness:    Joseph Guerra is a 77 y.o. male with past medical history significant for coronary artery disease.  In 2001 he suffered from myocardial infarction he required stent to LAD.  He had another event in 2019 he does have ischemic cardiomyopathy with ejection fraction 30% and then with guideline directed medical therapy Therapy improvement to 35 to 40% last check in June 2024.  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  He also have some difficulty swallowing extensive workup has been negative.  Recently he ended up in the hospital because of respiratory failure required intubation spent 25 hours on the rest.  And after that extubated.  He was also found to be in atrial fibrillation.  Anticoagulation has been initiated with 5 mg Eliquis twice daily and I brought him back to my office today to check his rhythm sadly he still in atrial fibrillation so we initiated conversation about cardioversion.  Since he does have baseline cardiomyopathy I think appropriate course of action will be to do transesophageal echocardiogram followed by cardioversion.  Both procedure explained to him including to his family his wife and his daughter participated in this discussion we all spoke [place].  Procedure explained including all risk benefits as well as alternatives.  He agreed to proceed.  Past Medical History:  Diagnosis Date   Arthritis associated with another disorder 12/17/2011   Atherosclerotic heart disease of native coronary artery without angina pectoris  12/17/2011   Formatting of this note might be different from the original. Myocardial infarction 2002 with apical hypokinesis Formatting of this note might be different from the original. Managed by Dr. Bing Matter   CAD (coronary artery disease)    a. prior MI in 2001 with stents placed at that time (patient unsure of which arteries)   Chronic dermatitis 10/24/2015   Collagenous colitis 10/16/2021   Coronary artery disease 12/17/2011   Overview:  Myocardial infarction 2002 with apical hypokinesis   Dyslipidemia (high LDL; low HDL) 06/18/2016   Essential hypertension 10/24/2015   Hypercholesteremia    Hyperlipidemia LDL goal <70    Hypertension    Hypokalemia    Ischemic cardiomyopathy    Ejection fraction 30% in June 2018   Knee pain, left 05/07/2016   PVC's (premature ventricular contractions) 03/28/2017   ST elevation myocardial infarction (STEMI) (HCC)    ST elevation myocardial infarction (STEMI) of anterolateral wall (HCC) 02/06/2017    Past Surgical History:  Procedure Laterality Date   CORONARY ANGIOPLASTY WITH STENT PLACEMENT     CORONARY BALLOON ANGIOPLASTY N/A 02/06/2017   Procedure: Coronary Balloon Angioplasty;  Surgeon: Iran Ouch, MD;  Location: [facility] INVASIVE CV LAB;  Service: Cardiovascular;  Laterality: N/A;   LEFT HEART CATH AND CORONARY ANGIOGRAPHY N/A 02/06/2017   Procedure: Left Heart Cath and Coronary Angiography;  Surgeon: Iran Ouch, MD;  Location: [facility] INVASIVE CV LAB;  Service: Cardiovascular;  Laterality: N/A;    Current Medications: Current Meds  Medication Sig   aspirin 81 MG chewable tablet Chew  1 tablet (81 mg total) by mouth daily.   atorvastatin (LIPITOR) 80 MG tablet Take 80 mg by mouth at bedtime.   Budeson-Glycopyrrol-Formoterol (BREZTRI AEROSPHERE) 160-9-4.8 MCG/ACT AERO Inhale 2 puffs into the lungs in the morning and at bedtime.   carvedilol (COREG) 12.5 MG tablet Take 1 tablet (12.5 mg total) by mouth 2 (two) times daily with a meal.    clobetasol cream (TEMOVATE) 0.05 % Apply 1 application topically 2 (two) times daily.   ELIQUIS 5 MG TABS tablet Take 5 mg by mouth 2 (two) times daily.   fluticasone (FLONASE) 50 MCG/ACT nasal spray Place 1 spray into both nostrils daily.   furosemide (LASIX) 20 MG tablet Take 1 tablet (20 mg total) by mouth daily.   ipratropium-albuterol (DUONEB) 0.5-2.5 (3) MG/3ML SOLN Inhale 3 mLs into the lungs every 6 (six) hours as needed (SOB).   isosorbide mononitrate (IMDUR) 30 MG 24 hr tablet Take 1 tablet by mouth once daily (Patient taking differently: Take 30 mg by mouth daily.)   levETIRAcetam (KEPPRA) 500 MG tablet Take 500 mg by mouth 2 (two) times daily.   pantoprazole (PROTONIX) 40 MG tablet Take 1 tablet by mouth once daily   Spacer/Aero-Holding Chambers (AEROCHAMBER MV) inhaler Use as instructed (Patient taking differently: 1 each by Other route See admin instructions. Use as instructed)   [DISCONTINUED] amoxicillin-clavulanate (AUGMENTIN) 875-125 MG tablet Take 1 tablet by mouth 2 (two) times daily.   [DISCONTINUED] clopidogrel (PLAVIX) 75 MG tablet Take 75 mg by mouth daily.   [DISCONTINUED] folic acid (FOLVITE) 400 MCG tablet Take 400 mcg by mouth daily.     Allergies:   Patient has no known allergies.   Social History   Socioeconomic History   Marital status: Married    Spouse name: Not on file   Number of children: Not on file   Years of education: Not on file   Highest education level: Not on file  Occupational History   Not on file  Tobacco Use   Smoking status: Former   Smokeless tobacco: Never  Vaping Use   Vaping status: Never Used  Substance and Sexual Activity   Alcohol use: Yes    Comment: occasional. Glass of wine once in awhile   Drug use: No   Sexual activity: Not on file  Other Topics Concern   Not on file  Social History Narrative   Right Handed    Lives in a one story home with his wife.    Social Determinants of Health   Financial Resource Strain:  Not on file  Food Insecurity: Low Risk  (07/04/2023)   Received from [facility]   Hunger Vital Sign    Worried About Running Out of Food in the Last Year: Never true    Ran Out of Food in the Last Year: Never true  Transportation Needs: No Transportation Needs (07/04/2023)   Received from [employer]    In the past 12 months, has lack of reliable transportation kept you from medical appointments, meetings, work or from getting things needed for daily living? : No  Physical Activity: Not on file  Stress: Not on file  Social Connections: Not on file     Family History: The patient's family history includes Heart disease in his father. ROS:   Please see the history of present illness.    All 14 point review of systems negative except as described per history of present illness  EKGs/Labs/Other Studies Reviewed:    EKG  Interpretation Date/Time:  Tuesday July 09 2023 10:49:57 EST Ventricular Rate:  79 PR Interval:    QRS Duration:  102 QT Interval:  376 QTC Calculation: 431 R Axis:   92  Text Interpretation: Atrial fibrillation with premature ventricular or aberrantly conducted complexes Rightward axis Anterior infarct (cited on or before 07-Feb-2017) When compared with ECG of 17-Jun-2023 11:04, No significant change was found Confirmed by Gypsy Balsam [phone]) on 07/09/2023 11:55:48 AM    Recent Labs: 08/29/2022: TSH 1.550 06/17/2023: NT-Pro BNP 765 06/26/2023: BUN 15; Creatinine, Ser 0.74; Potassium 4.8; Sodium 143  Recent Lipid Panel    Component Value Date/Time   CHOL 126 02/07/2023 0919   TRIG 62 02/07/2023 0919   HDL 69 02/07/2023 0919   CHOLHDL 1.8 02/07/2023 0919   CHOLHDL 3.0 02/07/2017 0236   VLDL 15 02/07/2017 0236   LDLCALC 44 02/07/2023 0919    Physical Exam:    VS:  BP 100/62 (BP Location: Left Arm, Patient Position: Sitting)   Pulse 74   Ht 5\' 9"  (1.753 m)   Wt 162 lb (73.5 kg)   SpO2 100%   BMI 23.92 kg/m     Wt  Readings from Last 3 Encounters:  07/09/23 162 lb (73.5 kg)  06/17/23 172 lb 6.4 oz (78.2 kg)  02/19/23 167 lb 9.6 oz (76 kg)     GEN:  Well nourished, well developed in no acute distress HEENT: Normal NECK: No JVD; No carotid bruits LYMPHATICS: No lymphadenopathy CARDIAC: Irregularly, no murmurs, no rubs, no gallops RESPIRATORY:  Clear to auscultation without rales, wheezing or rhonchi  ABDOMEN: Soft, non-tender, non-distended MUSCULOSKELETAL:  No edema; No deformity  SKIN: Warm and dry LOWER EXTREMITIES: no swelling NEUROLOGIC:  Alert and oriented x 3 PSYCHIATRIC:  Normal affect   ASSESSMENT:    1. Persistent atrial fibrillation (HCC)   2. Coronary artery disease involving native coronary artery of native heart without angina pectoris   3. Carotid stenosis, asymptomatic, right   4. Ischemic cardiomyopathy    PLAN:    In order of problems listed above:  Persistent atrial fibrillation, anticoag without any interruption for at least 3 weeks dose is appropriate to his weight age and kidney function.  I think we reached the point of cardioversion will be required.  Procedure explained to him including all risk benefits as alternative.  Because of his cardiomyopathy and high risk for thrombosis he will have transesophageal echocardiogram done.  His CHADS2 Vascor equals 5. Cardiomyopathy with ejection fraction 35 to 40%.  He is on carvedilol 12.5 twice daily, however because of low blood pressure I cannot add any medication at this stage I hope with cardioversion to sinus rhythm his blood pressure will improve and I will be able to add some ARB and hopefully eventually Entresto. Coronary disease.  No recent issue continue with Eliquis and antiplatelet therapy.  He is also on Imdur.   Medication Adjustments/Labs and Tests Ordered: Current medicines are reviewed at length with the patient today.  Concerns regarding medicines are outlined above.  Orders Placed This Encounter  Procedures    CBC   EKG 12-Lead   Medication changes: No orders of the defined types were placed in this encounter.   Signed, Georgeanna Lea, MD, [facility] 07/09/2023 11:56 AM    [facility]

## 2023-07-09 NOTE — Patient Instructions (Addendum)
Medication Instructions:  Your physician recommends that you continue on your current medications as directed. Please refer to the Current Medication list given to you today.  *If you need a refill on your cardiac medications before your next appointment, please call your pharmacy*   Lab Work: None Ordered If you have labs (blood work) drawn today and your tests are completely normal, you will receive your results only by: MyChart Message (if you have MyChart) OR A paper copy in the mail If you have any lab test that is abnormal or we need to change your treatment, we will call you to review the results.   Testing/Procedures:    Dear Joseph Guerra  You are scheduled for a TEE (Transesophageal Echocardiogram) Guided Cardioversion on Tuesday, November 26 with Dr. Jacques Navy.  Please arrive at the [facility] (Main Entrance A) at [facility]: [address] [place], [place] [address] at 6:00 AM (This time is 1.5 hour(s) before your procedure to ensure your preparation). Free valet parking service is available. You will check in at ADMITTING. The support person will be asked to wait in the waiting room.  It is OK to have someone drop you off and come back when you are ready to be discharged.     DIET:  Nothing to eat or drink after midnight except a sip of water with medications (see medication instructions below)  Continue taking your anticoagulant (blood thinner): Apixaban (Eliquis).  You will need to continue this after your procedure until you are told by your provider that it is safe to stop.    LABS: BMP- 06-26-23 CBC 07-09-23  FYI:  For your safety, and to allow [place] to monitor your vital signs accurately during the surgery/procedure we request: If you have artificial nails, gel coating, SNS etc, please have those removed prior to your surgery/procedure. Not having the nail coverings /polish removed may result in cancellation or delay of your surgery/procedure.  You must  have a responsible person to drive you home and stay in the waiting area during your procedure. Failure to do so could result in cancellation.  Bring your insurance cards.  *Special Note: Every effort is made to have your procedure done on time. Occasionally there are emergencies that occur at the hospital that may cause delays. Please be patient if a delay does occur.       Follow-Up: At [facility], you and your health needs are our priority.  As part of our continuing mission to provide you with exceptional heart care, we have created designated Provider Care Teams.  These Care Teams include your primary Cardiologist (physician) and Advanced Practice Providers (APPs -  Physician Assistants and Nurse Practitioners) who all work together to provide you with the care you need, when you need it.  We recommend signing up for the patient portal called "MyChart".  Sign up information is provided on this After Visit Summary.  MyChart is used to connect with patients for Virtual Visits (Telemedicine).  Patients are able to view lab/test results, encounter notes, upcoming appointments, etc.  Non-urgent messages can be sent to your provider as well.   To learn more about what you can do with MyChart, go to [email or web address].    Your next appointment:   2 month(s)  The format for your next appointment:   In Person  Provider:   Gypsy Balsam, MD    Other Instructions NA

## 2023-07-09 NOTE — Progress Notes (Signed)
Cardiology Office Note:    Date:  07/09/2023   ID:  Joseph Guerra, DOB 26-Mar-1946, MRN [id number]  PCP:  Andreas Blower., MD  Cardiologist:  Gypsy Balsam, MD    Referring MD: Andreas Blower., MD   Chief Complaint  Patient presents with   Follow-up    History of Present Illness:    Joseph Guerra is a 77 y.o. male with past medical history significant for coronary artery disease.  In 2001 he suffered from myocardial infarction he required stent to LAD.  He had another event in 2019 he does have ischemic cardiomyopathy with ejection fraction 30% and then with guideline directed medical therapy Therapy improvement to 35 to 40% last check in June 2024.  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  He also have some difficulty swallowing extensive workup has been negative.  Recently he ended up in the hospital because of respiratory failure required intubation spent 25 hours on the rest.  And after that extubated.  He was also found to be in atrial fibrillation.  Anticoagulation has been initiated with 5 mg Eliquis twice daily and I brought him back to my office today to check his rhythm sadly he still in atrial fibrillation so we initiated conversation about cardioversion.  Since he does have baseline cardiomyopathy I think appropriate course of action will be to do transesophageal echocardiogram followed by cardioversion.  Both procedure explained to him including to his family his wife and his daughter participated in this discussion we all spoke [place].  Procedure explained including all risk benefits as well as alternatives.  He agreed to proceed.  Past Medical History:  Diagnosis Date   Arthritis associated with another disorder 12/17/2011   Atherosclerotic heart disease of native coronary artery without angina pectoris  12/17/2011   Formatting of this note might be different from the original. Myocardial infarction 2002 with apical hypokinesis Formatting of this note might be different from the original. Managed by Dr. Bing Matter   CAD (coronary artery disease)    a. prior MI in 2001 with stents placed at that time (patient unsure of which arteries)   Chronic dermatitis 10/24/2015   Collagenous colitis 10/16/2021   Coronary artery disease 12/17/2011   Overview:  Myocardial infarction 2002 with apical hypokinesis   Dyslipidemia (high LDL; low HDL) 06/18/2016   Essential hypertension 10/24/2015   Hypercholesteremia    Hyperlipidemia LDL goal <70    Hypertension    Hypokalemia    Ischemic cardiomyopathy    Ejection fraction 30% in June 2018   Knee pain, left 05/07/2016   PVC's (premature ventricular contractions) 03/28/2017   ST elevation myocardial infarction (STEMI) (HCC)    ST elevation myocardial infarction (STEMI) of anterolateral wall (HCC) 02/06/2017    Past Surgical History:  Procedure Laterality Date   CORONARY ANGIOPLASTY WITH STENT PLACEMENT     CORONARY BALLOON ANGIOPLASTY N/A 02/06/2017   Procedure: Coronary Balloon Angioplasty;  Surgeon: Iran Ouch, MD;  Location: [facility] INVASIVE CV LAB;  Service: Cardiovascular;  Laterality: N/A;   LEFT HEART CATH AND CORONARY ANGIOGRAPHY N/A 02/06/2017   Procedure: Left Heart Cath and Coronary Angiography;  Surgeon: Iran Ouch, MD;  Location: [facility] INVASIVE CV LAB;  Service: Cardiovascular;  Laterality: N/A;    Current Medications: Current Meds  Medication Sig   aspirin 81 MG chewable tablet Chew  1 tablet (81 mg total) by mouth daily.   atorvastatin (LIPITOR) 80 MG tablet Take 80 mg by mouth at bedtime.   Budeson-Glycopyrrol-Formoterol (BREZTRI AEROSPHERE) 160-9-4.8 MCG/ACT AERO Inhale 2 puffs into the lungs in the morning and at bedtime.   carvedilol (COREG) 12.5 MG tablet Take 1 tablet (12.5 mg total) by mouth 2 (two) times daily with a meal.    clobetasol cream (TEMOVATE) 0.05 % Apply 1 application topically 2 (two) times daily.   ELIQUIS 5 MG TABS tablet Take 5 mg by mouth 2 (two) times daily.   fluticasone (FLONASE) 50 MCG/ACT nasal spray Place 1 spray into both nostrils daily.   furosemide (LASIX) 20 MG tablet Take 1 tablet (20 mg total) by mouth daily.   ipratropium-albuterol (DUONEB) 0.5-2.5 (3) MG/3ML SOLN Inhale 3 mLs into the lungs every 6 (six) hours as needed (SOB).   isosorbide mononitrate (IMDUR) 30 MG 24 hr tablet Take 1 tablet by mouth once daily (Patient taking differently: Take 30 mg by mouth daily.)   levETIRAcetam (KEPPRA) 500 MG tablet Take 500 mg by mouth 2 (two) times daily.   pantoprazole (PROTONIX) 40 MG tablet Take 1 tablet by mouth once daily   Spacer/Aero-Holding Chambers (AEROCHAMBER MV) inhaler Use as instructed (Patient taking differently: 1 each by Other route See admin instructions. Use as instructed)   [DISCONTINUED] amoxicillin-clavulanate (AUGMENTIN) 875-125 MG tablet Take 1 tablet by mouth 2 (two) times daily.   [DISCONTINUED] clopidogrel (PLAVIX) 75 MG tablet Take 75 mg by mouth daily.   [DISCONTINUED] folic acid (FOLVITE) 400 MCG tablet Take 400 mcg by mouth daily.     Allergies:   Patient has no known allergies.   Social History   Socioeconomic History   Marital status: Married    Spouse name: Not on file   Number of children: Not on file   Years of education: Not on file   Highest education level: Not on file  Occupational History   Not on file  Tobacco Use   Smoking status: Former   Smokeless tobacco: Never  Vaping Use   Vaping status: Never Used  Substance and Sexual Activity   Alcohol use: Yes    Comment: occasional. Glass of wine once in awhile   Drug use: No   Sexual activity: Not on file  Other Topics Concern   Not on file  Social History Narrative   Right Handed    Lives in a one story home with his wife.    Social Determinants of Health   Financial Resource Strain:  Not on file  Food Insecurity: Low Risk  (07/04/2023)   Received from [facility]   Hunger Vital Sign    Worried About Running Out of Food in the Last Year: Never true    Ran Out of Food in the Last Year: Never true  Transportation Needs: No Transportation Needs (07/04/2023)   Received from [facility]    In the past 12 months, has lack of reliable transportation kept you from medical appointments, meetings, work or from getting things needed for daily living? : No  Physical Activity: Not on file  Stress: Not on file  Social Connections: Not on file     Family History: The patient's family history includes Heart disease in his father. ROS:   Please see the history of present illness.    All 14 point review of systems negative except as described per history of present illness  EKGs/Labs/Other Studies Reviewed:    EKG  Interpretation Date/Time:  Tuesday July 09 2023 10:49:57 EST Ventricular Rate:  79 PR Interval:    QRS Duration:  102 QT Interval:  376 QTC Calculation: 431 R Axis:   92  Text Interpretation: Atrial fibrillation with premature ventricular or aberrantly conducted complexes Rightward axis Anterior infarct (cited on or before 07-Feb-2017) When compared with ECG of 17-Jun-2023 11:04, No significant change was found Confirmed by Gypsy Balsam [phone]) on 07/09/2023 11:55:48 AM    Recent Labs: 08/29/2022: TSH 1.550 06/17/2023: NT-Pro BNP 765 06/26/2023: BUN 15; Creatinine, Ser 0.74; Potassium 4.8; Sodium 143  Recent Lipid Panel    Component Value Date/Time   CHOL 126 02/07/2023 0919   TRIG 62 02/07/2023 0919   HDL 69 02/07/2023 0919   CHOLHDL 1.8 02/07/2023 0919   CHOLHDL 3.0 02/07/2017 0236   VLDL 15 02/07/2017 0236   LDLCALC 44 02/07/2023 0919    Physical Exam:    VS:  BP 100/62 (BP Location: Left Arm, Patient Position: Sitting)   Pulse 74   Ht 5\' 9"  (1.753 m)   Wt 162 lb (73.5 kg)   SpO2 100%   BMI 23.92 kg/m     Wt  Readings from Last 3 Encounters:  07/09/23 162 lb (73.5 kg)  06/17/23 172 lb 6.4 oz (78.2 kg)  02/19/23 167 lb 9.6 oz (76 kg)     GEN:  Well nourished, well developed in no acute distress HEENT: Normal NECK: No JVD; No carotid bruits LYMPHATICS: No lymphadenopathy CARDIAC: Irregularly, no murmurs, no rubs, no gallops RESPIRATORY:  Clear to auscultation without rales, wheezing or rhonchi  ABDOMEN: Soft, non-tender, non-distended MUSCULOSKELETAL:  No edema; No deformity  SKIN: Warm and dry LOWER EXTREMITIES: no swelling NEUROLOGIC:  Alert and oriented x 3 PSYCHIATRIC:  Normal affect   ASSESSMENT:    1. Persistent atrial fibrillation (HCC)   2. Coronary artery disease involving native coronary artery of native heart without angina pectoris   3. Carotid stenosis, asymptomatic, right   4. Ischemic cardiomyopathy    PLAN:    In order of problems listed above:  Persistent atrial fibrillation, anticoag without any interruption for at least 3 weeks dose is appropriate to his weight age and kidney function.  I think we reached the point of cardioversion will be required.  Procedure explained to him including all risk benefits as alternative.  Because of his cardiomyopathy and high risk for thrombosis he will have transesophageal echocardiogram done.  His CHADS2 Vascor equals 5. Cardiomyopathy with ejection fraction 35 to 40%.  He is on carvedilol 12.5 twice daily, however because of low blood pressure I cannot add any medication at this stage I hope with cardioversion to sinus rhythm his blood pressure will improve and I will be able to add some ARB and hopefully eventually Entresto. Coronary disease.  No recent issue continue with Eliquis and antiplatelet therapy.  He is also on Imdur.   Medication Adjustments/Labs and Tests Ordered: Current medicines are reviewed at length with the patient today.  Concerns regarding medicines are outlined above.  Orders Placed This Encounter  Procedures    CBC   EKG 12-Lead   Medication changes: No orders of the defined types were placed in this encounter.   Signed, Georgeanna Lea, MD, [facility] 07/09/2023 11:56 AM    [facility]

## 2023-07-10 LAB — CBC
Hematocrit: 42.8 % (ref 37.5–51.0)
Hemoglobin: 13.7 g/dL (ref 13.0–17.7)
MCH: 31.2 pg (ref 26.6–33.0)
MCHC: 32 g/dL (ref 31.5–35.7)
MCV: 98 fL — ABNORMAL HIGH (ref 79–97)
Platelets: 153 10*3/uL (ref 150–450)
RBC: 4.39 x10E6/uL (ref 4.14–5.80)
RDW: 11.9 % (ref 11.6–15.4)
WBC: 6.1 10*3/uL (ref 3.4–10.8)

## 2023-07-12 ENCOUNTER — Telehealth: Payer: Self-pay

## 2023-07-12 NOTE — Telephone Encounter (Signed)
Left message on My Chart with normal Lab results per Dr. Vanetta Shawl note. Routed to PCP.

## 2023-07-19 ENCOUNTER — Telehealth: Payer: Self-pay | Admitting: Cardiology

## 2023-07-19 NOTE — Telephone Encounter (Signed)
Pt requested to change TEE appt. - Changed to August 06 2023 at 10:30AM Pt notified. Dr. Duke Salvia.

## 2023-07-19 NOTE — Telephone Encounter (Signed)
Pt called in stating she needs to r/s her TEE. Please advise.

## 2023-08-05 NOTE — Progress Notes (Signed)
Spoke to patient's daughter and instructed them to come at 0930  and to be NPO after 0000.  Medications reviewed.    Confirmed that patient will have a ride home and someone to stay with them for 24 hours after the procedure.

## 2023-08-06 ENCOUNTER — Ambulatory Visit (HOSPITAL_COMMUNITY): Payer: Medicare Other | Admitting: Anesthesiology

## 2023-08-06 ENCOUNTER — Ambulatory Visit (HOSPITAL_COMMUNITY)
Admission: RE | Admit: 2023-08-06 | Discharge: 2023-08-06 | Disposition: A | Discharge status: Home / Self Care | Payer: Medicare Other | Source: Ambulatory Visit | Attending: Cardiovascular Disease | Admitting: Cardiovascular Disease

## 2023-08-06 ENCOUNTER — Other Ambulatory Visit: Payer: Self-pay

## 2023-08-06 ENCOUNTER — Ambulatory Visit (HOSPITAL_COMMUNITY)
Admission: RE | Admit: 2023-08-06 | Discharge: 2023-08-06 | Disposition: A | Discharge status: Home / Self Care | Payer: Medicare Other | Attending: Cardiovascular Disease | Admitting: Cardiovascular Disease

## 2023-08-06 ENCOUNTER — Encounter (HOSPITAL_COMMUNITY)
Admission: RE | Disposition: A | Discharge status: Home / Self Care | Payer: Self-pay | Source: Home / Self Care | Attending: Cardiovascular Disease

## 2023-08-06 ENCOUNTER — Encounter (HOSPITAL_COMMUNITY): Payer: Self-pay | Admitting: Cardiovascular Disease

## 2023-08-06 DIAGNOSIS — I6521 Occlusion and stenosis of right carotid artery: Secondary | ICD-10-CM | POA: Diagnosis not present

## 2023-08-06 DIAGNOSIS — Z79899 Other long term (current) drug therapy: Secondary | ICD-10-CM | POA: Diagnosis not present

## 2023-08-06 DIAGNOSIS — Z955 Presence of coronary angioplasty implant and graft: Secondary | ICD-10-CM | POA: Insufficient documentation

## 2023-08-06 DIAGNOSIS — I10 Essential (primary) hypertension: Secondary | ICD-10-CM | POA: Insufficient documentation

## 2023-08-06 DIAGNOSIS — I255 Ischemic cardiomyopathy: Secondary | ICD-10-CM | POA: Insufficient documentation

## 2023-08-06 DIAGNOSIS — I4891 Unspecified atrial fibrillation: Secondary | ICD-10-CM | POA: Diagnosis not present

## 2023-08-06 DIAGNOSIS — J449 Chronic obstructive pulmonary disease, unspecified: Secondary | ICD-10-CM | POA: Diagnosis not present

## 2023-08-06 DIAGNOSIS — Z8249 Family history of ischemic heart disease and other diseases of the circulatory system: Secondary | ICD-10-CM | POA: Diagnosis not present

## 2023-08-06 DIAGNOSIS — I2510 Atherosclerotic heart disease of native coronary artery without angina pectoris: Secondary | ICD-10-CM | POA: Insufficient documentation

## 2023-08-06 DIAGNOSIS — I252 Old myocardial infarction: Secondary | ICD-10-CM | POA: Diagnosis not present

## 2023-08-06 DIAGNOSIS — Z87891 Personal history of nicotine dependence: Secondary | ICD-10-CM | POA: Diagnosis not present

## 2023-08-06 DIAGNOSIS — I4819 Other persistent atrial fibrillation: Secondary | ICD-10-CM | POA: Insufficient documentation

## 2023-08-06 DIAGNOSIS — Z7901 Long term (current) use of anticoagulants: Secondary | ICD-10-CM | POA: Insufficient documentation

## 2023-08-06 HISTORY — PX: TRANSESOPHAGEAL ECHOCARDIOGRAM (CATH LAB): EP1270

## 2023-08-06 HISTORY — PX: CARDIOVERSION: EP1203

## 2023-08-06 LAB — POCT I-STAT, CHEM 8
BUN: 16 mg/dL (ref 8–23)
Calcium, Ion: 1.26 mmol/L (ref 1.15–1.40)
Chloride: 98 mmol/L (ref 98–111)
Creatinine, Ser: 0.8 mg/dL (ref 0.61–1.24)
Glucose, Bld: 92 mg/dL (ref 70–99)
HCT: 40 % (ref 39.0–52.0)
Hemoglobin: 13.6 g/dL (ref 13.0–17.0)
Potassium: 3.8 mmol/L (ref 3.5–5.1)
Sodium: 141 mmol/L (ref 135–145)
TCO2: 32 mmol/L (ref 22–32)

## 2023-08-06 LAB — ECHO TEE

## 2023-08-06 SURGERY — TRANSESOPHAGEAL ECHOCARDIOGRAM (TEE) (CATHLAB)
Anesthesia: Monitor Anesthesia Care

## 2023-08-06 MED ORDER — PROPOFOL 10 MG/ML IV BOLUS
INTRAVENOUS | Status: DC | PRN
  Administered 2023-08-06: 20 mg via INTRAVENOUS
  Administered 2023-08-06: 60 mg via INTRAVENOUS
  Administered 2023-08-06: 20 mg via INTRAVENOUS

## 2023-08-06 MED ORDER — LIDOCAINE 2% (20 MG/ML) 5 ML SYRINGE
INTRAMUSCULAR | Status: DC | PRN
  Administered 2023-08-06: 100 mg via INTRAVENOUS

## 2023-08-06 MED ORDER — PROPOFOL 500 MG/50ML IV EMUL
INTRAVENOUS | Status: DC | PRN
  Administered 2023-08-06: 75 ug/kg/min via INTRAVENOUS

## 2023-08-06 MED ORDER — SODIUM CHLORIDE 0.9 % IV SOLN: INTRAVENOUS | Status: DC

## 2023-08-06 SURGICAL SUPPLY — 1 items: PAD DEFIB RADIO PHYSIO CONN (PAD) ×1 IMPLANT

## 2023-08-06 NOTE — Transfer of Care (Signed)
Immediate Anesthesia Transfer of Care Note  Patient: Adnan Geck  Procedure(s) Performed: TRANSESOPHAGEAL ECHOCARDIOGRAM CARDIOVERSION (CATH LAB)  Patient Location: Cath Lab  Anesthesia Type:General  Level of Consciousness: awake, alert , and oriented  Airway & Oxygen Therapy: Patient Spontanous Breathing and Patient connected to nasal cannula oxygen  Post-op Assessment: Report given to RN, Post -op Vital signs reviewed and stable, Patient moving all extremities X 4, and Patient able to stick tongue midline  Post vital signs: Reviewed and stable  Last Vitals:  Vitals Value Taken Time  BP 106/59   Temp 98.6   Pulse 65   Resp 12   SpO2 96     Last Pain: There were no vitals filed for this visit.       Complications: No notable events documented.

## 2023-08-06 NOTE — Interval H&P Note (Signed)
History and Physical Interval Note:  08/06/2023 10:21 AM  Joseph Guerra  has presented today for surgery, with the diagnosis of AFIB.  The various methods of treatment have been discussed with the patient and family. After consideration of risks, benefits and other options for treatment, the patient has consented to  Procedure(s): TRANSESOPHAGEAL ECHOCARDIOGRAM (N/A) CARDIOVERSION (CATH LAB) (N/A) as a surgical intervention.  The patient's history has been reviewed, patient examined, no change in status, stable for surgery.  I have reviewed the patient's chart and labs.  Questions were answered to the patient's satisfaction.     Chilton Si, MD

## 2023-08-06 NOTE — Anesthesia Preprocedure Evaluation (Signed)
Anesthesia Evaluation  Patient identified by MRN, date of birth, ID band Patient awake    Reviewed: Allergy & Precautions, NPO status , Patient's Chart, lab work & pertinent test results, reviewed documented beta blocker date and time   History of Anesthesia Complications Negative for: history of anesthetic complications  Airway Mallampati: II  TM Distance: >3 FB Neck ROM: Full    Dental  (+) Edentulous Upper, Edentulous Lower   Pulmonary COPD, former smoker   breath sounds clear to auscultation       Cardiovascular hypertension, + CAD and + Past MI  (-) Cardiac Stents and (-) CABG  Rhythm:Regular Rate:Normal  EF 35-40%   Neuro/Psych neg Seizures    GI/Hepatic ,,,(+) neg Cirrhosis        Endo/Other    Renal/GU Renal disease     Musculoskeletal  (+) Arthritis ,    Abdominal   Peds  Hematology   Anesthesia Other Findings   Reproductive/Obstetrics                              Anesthesia Physical Anesthesia Plan  ASA: 3  Anesthesia Plan: MAC   Post-op Pain Management:    Induction:   PONV Risk Score and Plan: 1 and Propofol infusion  Airway Management Planned:   Additional Equipment:   Intra-op Plan:   Post-operative Plan: Extubation in OR  Informed Consent: I have reviewed the patients History and Physical, chart, labs and discussed the procedure including the risks, benefits and alternatives for the proposed anesthesia with the patient or authorized representative who has indicated his/her understanding and acceptance.       Plan Discussed with: CRNA  Anesthesia Plan Comments:          Anesthesia Quick Evaluation

## 2023-08-06 NOTE — Anesthesia Postprocedure Evaluation (Signed)
Anesthesia Post Note  Patient: Joseph Guerra  Procedure(s) Performed: TRANSESOPHAGEAL ECHOCARDIOGRAM CARDIOVERSION (CATH LAB)     Patient location during evaluation: PACU Anesthesia Type: MAC Level of consciousness: awake and alert Pain management: pain level controlled Vital Signs Assessment: post-procedure vital signs reviewed and stable Respiratory status: spontaneous breathing, nonlabored ventilation, respiratory function stable and patient connected to nasal cannula oxygen Cardiovascular status: stable and blood pressure returned to baseline Postop Assessment: no apparent nausea or vomiting Anesthetic complications: no   No notable events documented.  Last Vitals:  Vitals:   08/06/23 1232 08/06/23 1240  BP:  (!) 111/93  Pulse:  (!) 59  Resp:  15  Temp: 36.7 C   SpO2:  91%    Last Pain:  Vitals:   08/06/23 1232  TempSrc: Temporal  PainSc: 0-No pain                 Mariann Barter

## 2023-08-06 NOTE — CV Procedure (Signed)
Brief TEE Note  LVEF 30-35% No LA/LAA thrombus or masses  For additional details see full report.  Electrical Cardioversion Procedure Note Joseph Guerra [id number] October 13, 1945  Procedure: Electrical Cardioversion Indications:  Atrial Fibrillation  Procedure Details Consent: Risks of procedure as well as the alternatives and risks of each were explained to the (patient/caregiver).  Consent for procedure obtained. Time Out: Verified patient identification, verified procedure, site/side was marked, verified correct patient position, special equipment/implants available, medications/allergies/relevent history reviewed, required imaging and test results available.  Performed  Patient placed on cardiac monitor, pulse oximetry, supplemental oxygen as necessary.  Sedation given:  propofol Pacer pads placed anterior and posterior chest.  Cardioverted 1 time(s).  Cardioverted at 200J.  Evaluation Findings: Post procedure EKG shows: NSR Complications: None Patient did tolerate procedure well.   Chilton Si, MD 08/06/2023, 12:20 PM

## 2023-08-06 NOTE — Discharge Instructions (Signed)
Electrical Cardioversion Electrical cardioversion is the delivery of a jolt of electricity to restore a normal rhythm to the heart. A rhythm that is too fast or is not regular keeps the heart from pumping well. In this procedure, sticky patches or metal paddles are placed on the chest to deliver electricity to the heart from a device. This procedure may be done in an emergency if: There is low or no blood pressure as a result of the heart rhythm. Normal rhythm must be restored as fast as possible to protect the brain and heart from further damage. It may save a life. This may also be a scheduled procedure for irregular or fast heart rhythms that are not immediately life-threatening.  What can I expect after the procedure? Your blood pressure, heart rate, breathing rate, and blood oxygen level will be monitored until you leave the hospital or clinic. Your heart rhythm will be watched to make sure it does not change. You may have some redness on the skin where the shocks were given. Over the counter cortizone cream may be helpful.  Follow these instructions at home: Do not drive for 24 hours if you were given a sedative during your procedure. Take over-the-counter and prescription medicines only as told by your health care provider. Ask your health care provider how to check your pulse. Check it often. Rest for 48 hours after the procedure or as told by your health care provider. Avoid or limit your caffeine use as told by your health care provider. Keep all follow-up visits as told by your health care provider. This is important. Contact a health care provider if: You feel like your heart is beating too quickly or your pulse is not regular. You have a serious muscle cramp that does not go away. Get help right away if: You have discomfort in your chest. You are dizzy or you feel faint. You have trouble breathing or you are short of breath. Your speech is slurred. You have trouble moving an  arm or leg on one side of your body. Your fingers or toes turn cold or blue. Summary Electrical cardioversion is the delivery of a jolt of electricity to restore a normal rhythm to the heart. This procedure may be done right away in an emergency or may be a scheduled procedure if the condition is not an emergency. Generally, this is a safe procedure. After the procedure, check your pulse often as told by your health care provider. This information is not intended to replace advice given to you by your health care provider. Make sure you discuss any questions you have with your health care provider. Document Revised: 03/16/2019 Document Reviewed: 03/16/2019 Elsevier Patient Education  2020 [employer] TEE   YOU HAD AN CARDIAC PROCEDURE TODAY: Refer to the procedure report and other information in the discharge instructions given to you for any specific questions about what was found during the examination. If this information does not answer your questions, please call Dr. Verl Dicker office at [phone] to clarify.   DIET: Your first meal following the procedure should be a light meal and then it is ok to progress to your normal diet. A half-sandwich or bowl of soup is an example of a good first meal. Heavy or fried foods are harder to digest and may make you feel nauseous or bloated. Drink plenty of fluids but you should avoid alcoholic beverages for 24 hours. If you had a esophageal dilation, please see attached instructions for diet.   ACTIVITY:  Your care partner should take you home directly after the procedure. You should plan to take it easy, moving slowly for the rest of the day. You can resume normal activity the day after the procedure however YOU SHOULD NOT DRIVE, use power tools, machinery or perform tasks that involve climbing or major physical exertion for 24 hours (because of the sedation medicines used during the test).   SYMPTOMS TO REPORT IMMEDIATELY: A cardiologist can be  reached at any hour. Please call [phone] for any of the following symptoms:  Vomiting of blood or coffee ground material  New, significant abdominal pain  New, significant chest pain or pain under the shoulder blades  Painful or persistently difficult swallowing  New shortness of breath  Black, tarry-looking or red, bloody stools  FOLLOW UP:  Please also call with any specific questions about appointments or follow up tests.

## 2023-09-10 ENCOUNTER — Encounter: Payer: Self-pay | Admitting: Cardiology

## 2023-09-10 ENCOUNTER — Ambulatory Visit: Payer: Medicare Other | Attending: Cardiology | Admitting: Cardiology

## 2023-09-10 VITALS — BP 136/70 | HR 60 | Ht 69.0 in | Wt 165.0 lb

## 2023-09-10 DIAGNOSIS — I4819 Other persistent atrial fibrillation: Secondary | ICD-10-CM | POA: Diagnosis not present

## 2023-09-10 DIAGNOSIS — I2510 Atherosclerotic heart disease of native coronary artery without angina pectoris: Secondary | ICD-10-CM | POA: Diagnosis not present

## 2023-09-10 DIAGNOSIS — I10 Essential (primary) hypertension: Secondary | ICD-10-CM | POA: Diagnosis not present

## 2023-09-10 DIAGNOSIS — E785 Hyperlipidemia, unspecified: Secondary | ICD-10-CM | POA: Diagnosis not present

## 2023-09-10 DIAGNOSIS — R1310 Dysphagia, unspecified: Secondary | ICD-10-CM

## 2023-09-10 MED ORDER — LOSARTAN POTASSIUM 25 MG PO TABS: 25.0000 mg | ORAL_TABLET | Freq: Every day | ORAL | 3 refills | Status: AC

## 2023-09-10 NOTE — Patient Instructions (Addendum)
 Medication Instructions:   START: Losartan  25mg  1 tablet daily   Lab Work: 3rd Floor   [address]  Your physician recommends that you return for lab work in:   1 week You need to have labs done when you are fasting.  You can come Monday through Friday 8:00 am to 11:30AM and 1:00 to 4:00. You do not need to make an appointment as the order has already been placed.    Testing/Procedures: None Ordered   Follow-Up: At [facility], you and your health needs are our priority.  As part of our continuing mission to provide you with exceptional heart care, we have created designated Provider Care Teams.  These Care Teams include your primary Cardiologist (physician) and Advanced Practice Providers (APPs -  Physician Assistants and Nurse Practitioners) who all work together to provide you with the care you need, when you need it.  We recommend signing up for the patient portal called MyChart.  Sign up information is provided on this After Visit Summary.  MyChart is used to connect with patients for Virtual Visits (Telemedicine).  Patients are able to view lab/test results, encounter notes, upcoming appointments, etc.  Non-urgent messages can be sent to your provider as well.   To learn more about what you can do with MyChart, go to [email or web address].    Your next appointment:   6 week(s)  The format for your next appointment:   In Person  Provider:   Lamar Fitch, MD    Other Instructions NA

## 2023-09-10 NOTE — Progress Notes (Signed)
 Cardiology Office Note:    Date:  09/10/2023   ID:  Joseph Guerra, DOB 08-29-1945, MRN [id number]  PCP:  Delilah Murray HERO., MD  Cardiologist:  Lamar Fitch, MD    Referring MD: Delilah Murray HERO., MD   Chief Complaint  Patient presents with   Follow-up    History of Present Illness:    Joseph Guerra is a 78 y.o. male  with past medical history significant for coronary artery disease. In 2001 he suffered from myocardial infarction he required stent to LAD. He had another event in 2019 he does have ischemic cardiomyopathy with ejection fraction 30% and then with guideline directed medical therapy Therapy improvement to 35 to 40% last check in June 2024. 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 He also have some difficulty swallowing extensive workup has been negative. Recently he ended up in the hospital because of respiratory failure required intubation spent 25 hours on the rest. And after that extubated. He was also found to be in atrial fibrillation. Anticoagulation has been initiated with 5 mg Eliquis twice daily and I brought him back to my office  Since that time he did have transesophageal echocardiogram to confirm ejection fraction 30 to 35%, no thrombus, cardioversion has been done he comes today to months for follow-up he looks a little better still biggest obstacle is difficulty swallowing difficult talking.  But overall he seems to be doing a lipid better.  He is trying to build be more active  Past Medical History:  Diagnosis Date   Arthritis associated with another disorder 12/17/2011   Atherosclerotic heart disease of native coronary artery without angina pectoris 12/17/2011   Formatting of this note might be different from the original. Myocardial infarction 2002 with apical hypokinesis Formatting of this  note might be different from the original. Managed by Dr. Fitch   CAD (coronary artery disease)    a. prior MI in 2001 with stents placed at that time (patient unsure of which arteries)   Chronic dermatitis 10/24/2015   Collagenous colitis 10/16/2021   Coronary artery disease 12/17/2011   Overview:  Myocardial infarction 2002 with apical hypokinesis   Dyslipidemia (high LDL; low HDL) 06/18/2016   Essential hypertension 10/24/2015   Hypercholesteremia    Hyperlipidemia LDL goal <70    Hypertension    Hypokalemia    Ischemic cardiomyopathy    Ejection fraction 30% in June 2018   Knee pain, left 05/07/2016   PVC's (premature ventricular contractions) 03/28/2017   ST elevation myocardial infarction (STEMI) (HCC)    ST elevation myocardial infarction (STEMI) of anterolateral wall (HCC) 02/06/2017    Past Surgical History:  Procedure Laterality Date   CARDIOVERSION N/A 08/06/2023   Procedure: CARDIOVERSION (CATH LAB);  Surgeon: Raford [facility], MD;  [facility] INVASIVE CV LAB;  Service: Cardiovascular;  Laterality: N/A;   CORONARY ANGIOPLASTY WITH STENT PLACEMENT     CORONARY BALLOON ANGIOPLASTY N/A 02/06/2017   Procedure: Coronary Balloon Angioplasty;  Surgeon: Darron Deatrice LABOR, MD;  Location: [facility] INVASIVE CV LAB;  Service: Cardiovascular;  Laterality: N/A;   LEFT HEART CATH AND CORONARY ANGIOGRAPHY N/A 02/06/2017   Procedure: Left Heart Cath and Coronary Angiography;  Surgeon: Darron Deatrice LABOR, MD;  Location: [facility] INVASIVE CV LAB;  Service: Cardiovascular;  Laterality: N/A;   TRANSESOPHAGEAL ECHOCARDIOGRAM (CATH LAB) N/A 08/06/2023   Procedure: TRANSESOPHAGEAL ECHOCARDIOGRAM;  Surgeon: Raford [facility], MD;  Location: [facility] INVASIVE CV LAB;  Service: Cardiovascular;  Laterality: N/A;    Current Medications: Current Meds  Medication Sig   Adalimumab (HUMIRA PEN [place]) Inject 1 application  into the skin daily.   aspirin  81 MG chewable tablet Chew 1 tablet (81 mg total) by mouth daily.    atorvastatin  (LIPITOR ) 80 MG tablet Take 80 mg by mouth at bedtime.   Budeson-Glycopyrrol-Formoterol (BREZTRI  AEROSPHERE) 160-9-4.8 MCG/ACT AERO Inhale 2 puffs into the lungs in the morning and at bedtime.   carvedilol  (COREG ) 12.5 MG tablet Take 1 tablet (12.5 mg total) by mouth 2 (two) times daily with a meal.   clobetasol cream (TEMOVATE) 0.05 % Apply 1 application  topically 2 (two) times daily as needed (scalp).   ELIQUIS 5 MG TABS tablet Take 5 mg by mouth 2 (two) times daily.   fluticasone  (FLONASE ) 50 MCG/ACT nasal spray Place 1 spray into both nostrils daily.   furosemide  (LASIX ) 20 MG tablet Take 1 tablet (20 mg total) by mouth daily.   ipratropium-albuterol  (DUONEB) 0.5-2.5 (3) MG/3ML SOLN Inhale 3 mLs into the lungs every 6 (six) hours as needed (SOB).   isosorbide  mononitrate (IMDUR ) 30 MG 24 hr tablet Take 1 tablet by mouth once daily (Patient taking differently: Take 30 mg by mouth daily.)   levETIRAcetam (KEPPRA) 500 MG tablet Take 500 mg by mouth 2 (two) times daily.   pantoprazole  (PROTONIX ) 40 MG tablet Take 1 tablet by mouth once daily   Spacer/Aero-Holding Chambers (AEROCHAMBER MV) inhaler Use as instructed (Patient taking differently: 1 each by Other route See admin instructions. Use as instructed)   Vitamin D, Ergocalciferol, (DRISDOL) 1.25 MG (50000 UNIT) CAPS capsule Take 50,000 Units by mouth once a week.     Allergies:   Patient has no known allergies.   Social History   Socioeconomic History   Marital status: Married    Spouse name: Not on file   Number of children: Not on file   Years of education: Not on file   Highest education level: Not on file  Occupational History   Not on file  Tobacco Use   Smoking status: Former   Smokeless tobacco: Never  Vaping Use   Vaping status: Never Used  Substance and Sexual Activity   Alcohol use: Yes    Comment: occasional. Glass of wine once in awhile   Drug use: No   Sexual activity: Not on file  Other Topics  Concern   Not on file  Social History Narrative   Right Handed    Lives in a one story home with his wife.    Social Drivers of Corporate Investment Banker Strain: Not on file  Food Insecurity: Low Risk  (09/03/2023)   Received from [facility]   Hunger Vital Sign    Worried About Running Out of Food in the Last Year: Never true    Ran Out of Food in the Last Year: Never true  Transportation Needs: No Transportation Needs (09/03/2023)   Received from [facility]    In the past 12 months, has lack of reliable transportation kept you from medical appointments, meetings, work or from getting things needed for daily living? : No  Physical Activity: Not on file  Stress: Not on file  Social Connections: Not on file     Family History: The patient's family history includes Heart disease in his father. ROS:   Please see the history of present illness.  All 14 point review of systems negative except as described per history of present illness  EKGs/Labs/Other Studies Reviewed:         Recent Labs: 06/17/2023: NT-Pro BNP 765 07/09/2023: Platelets 153 08/06/2023: BUN 16; Creatinine, Ser 0.80; Hemoglobin 13.6; Potassium 3.8; Sodium 141  Recent Lipid Panel    Component Value Date/Time   CHOL 126 02/07/2023 0919   TRIG 62 02/07/2023 0919   HDL 69 02/07/2023 0919   CHOLHDL 1.8 02/07/2023 0919   CHOLHDL 3.0 02/07/2017 0236   VLDL 15 02/07/2017 0236   LDLCALC 44 02/07/2023 0919    Physical Exam:    VS:  BP 136/70 (BP Location: Right Arm, Patient Position: Sitting)   Pulse 60   Ht 5' 9 (1.753 m)   Wt 165 lb (74.8 kg)   SpO2 95%   BMI 24.37 kg/m     Wt Readings from Last 3 Encounters:  09/10/23 165 lb (74.8 kg)  08/06/23 165 lb (74.8 kg)  07/09/23 162 lb (73.5 kg)     GEN:  Well nourished, well developed in no acute distress HEENT: Normal NECK: No JVD; No carotid bruits LYMPHATICS: No lymphadenopathy CARDIAC: RRR, no murmurs, no rubs, no  gallops RESPIRATORY:  Clear to auscultation without rales, wheezing or rhonchi  ABDOMEN: Soft, non-tender, non-distended MUSCULOSKELETAL:  No edema; No deformity  SKIN: Warm and dry LOWER EXTREMITIES: no swelling NEUROLOGIC:  Alert and oriented x 3 PSYCHIATRIC:  Normal affect   ASSESSMENT:    1. Persistent atrial fibrillation (HCC)   2. Atherosclerosis of native coronary artery of native heart without angina pectoris   3. Essential hypertension   4. Dyslipidemia (high LDL; low HDL)    PLAN:    In order of problems listed above:  1.  Paroxysmal atrial fibrillation.  Will do EKG to confirm the rhythm continue anticoagulation. Coronary disease.  Stable from that point we will continue present management. Essential hypertension blood pressure better controlled with. Cardiomyopathy will start losartan  25 mg daily Chem-7 next week.  Will see him back in about 2 months   Medication Adjustments/Labs and Tests Ordered: Current medicines are reviewed at length with the patient today.  Concerns regarding medicines are outlined above.  Orders Placed This Encounter  Procedures   EKG 12-Lead   Medication changes: No orders of the defined types were placed in this encounter.   Signed, Lamar DOROTHA Fitch, MD, [facility] 09/10/2023 1:28 PM    [facility]

## 2023-09-10 NOTE — Progress Notes (Signed)
 Joseph Guerra

## 2023-09-18 ENCOUNTER — Telehealth: Payer: Self-pay | Admitting: Gastroenterology

## 2023-09-18 NOTE — Telephone Encounter (Signed)
Good morning Dr. Chales Abrahams,   We received a call from this patient wishing to establish GI care with you due to wanting to have all his provider's under [facility]. Patient last had EGD and colonoscopy with [facility] in 08/2021. Patient is having issues swallowing and would like to discuss EGD. Records are in Care Everywhere for you to review. Would you please advise on scheduling?  Thank you.

## 2023-09-21 LAB — BASIC METABOLIC PANEL
BUN/Creatinine Ratio: 26 — ABNORMAL HIGH (ref 10–24)
BUN: 19 mg/dL (ref 8–27)
CO2: 28 mmol/L (ref 20–29)
Calcium: 9.5 mg/dL (ref 8.6–10.2)
Chloride: 101 mmol/L (ref 96–106)
Creatinine, Ser: 0.73 mg/dL — ABNORMAL LOW (ref 0.76–1.27)
Glucose: 84 mg/dL (ref 70–99)
Potassium: 4.3 mmol/L (ref 3.5–5.2)
Sodium: 144 mmol/L (ref 134–144)
eGFR: 95 mL/min/{1.73_m2} (ref >59)

## 2023-09-24 NOTE — Telephone Encounter (Signed)
OK for APP clinic-first available RG

## 2023-09-27 ENCOUNTER — Encounter: Payer: Self-pay | Admitting: Gastroenterology

## 2023-10-18 ENCOUNTER — Ambulatory Visit: Payer: Medicare Other | Admitting: Cardiology

## 2023-11-28 ENCOUNTER — Encounter: Payer: Self-pay | Admitting: Gastroenterology

## 2023-11-28 ENCOUNTER — Ambulatory Visit: Payer: Medicare Other | Admitting: Gastroenterology

## 2023-11-28 VITALS — BP 124/70 | HR 61 | Ht 69.0 in | Wt 165.0 lb

## 2023-11-28 DIAGNOSIS — R1310 Dysphagia, unspecified: Secondary | ICD-10-CM | POA: Diagnosis not present

## 2023-11-28 DIAGNOSIS — R1319 Other dysphagia: Secondary | ICD-10-CM

## 2023-11-28 DIAGNOSIS — K219 Gastro-esophageal reflux disease without esophagitis: Secondary | ICD-10-CM

## 2023-11-28 DIAGNOSIS — R490 Dysphonia: Secondary | ICD-10-CM

## 2023-11-28 NOTE — Patient Instructions (Signed)
 _______________________________________________________  If your blood pressure at your visit was 140/90 or greater, please contact your primary care physician to follow up on this.  _______________________________________________________  If you are age 76 or older, your body mass index should be between 23-30. Your Body mass index is 24.37 kg/m. If this is out of the aforementioned range listed, please consider follow up with your Primary Care Provider.  If you are age 35 or younger, your body mass index should be between 19-25. Your Body mass index is 24.37 kg/m. If this is out of the aformentioned range listed, please consider follow up with your Primary Care Provider.   ________________________________________________________  The [facility] providers would like to encourage you to use [facility] to communicate with providers for non-urgent requests or questions.  Due to long hold times on the telephone, sending your provider a message by [facility] may be a faster and more efficient way to get a response.  Please allow 48 business hours for a response.  Please remember that this is for non-urgent requests.  _______________________________________________________    Unice Cobble [employer]  Zosta?e? zaplanowany na badanie barytowe w [facility] (1. pi?tro szpitala) na 15 kwietnia 2025 r. o godzinie 11:00. Prosimy o przybycie 30 minut przed umwion? wizyt? w celu rejestracji. Upewnij si?, ?e nie spo?ywasz ?adnego jedzenia ani picia przez 3 godziny przed badaniem. Je?li z Marijean Niemann powodu musisz zmieni? termin, skontaktuj si? z radiologi? pod numerem [phone], aby to zrobi?Marland Kitchen  You have been scheduled for a Barium Esophogram at [facility] (1st floor of the hospital) on 12-10-23 at 11am. Please arrive 30 minutes prior to your appointment for registration. Make certain not to have anything to eat or drink 3 hours prior to your test. If you need to reschedule for any reason,  please contact radiology at [phone] to do so. __________________________________________________________________ A barium swallow is an examination that concentrates on views of the esophagus. This tends to be a double contrast exam (barium and two liquids which, when combined, create a gas to distend the wall of the oesophagus) or single contrast (non-ionic iodine based). The study is usually tailored to your symptoms so a good history is essential. Attention is paid during the study to the form, structure and configuration of the esophagus, looking for functional disorders (such as aspiration, dysphagia, achalasia, motility and reflux) EXAMINATION You may be asked to change into a gown, depending on the type of swallow being performed. A radiologist and radiographer will perform the procedure. The radiologist will advise you of the type of contrast selected for your procedure and direct you during the exam. You will be asked to stand, sit or lie in several different positions and to hold a small amount of fluid in your mouth before being asked to swallow while the imaging is performed .In some instances you may be asked to swallow barium coated marshmallows to assess the motility of a solid food bolus. The exam can be recorded as a digital or video fluoroscopy procedure. POST PROCEDURE It will take 1-2 days for the barium to pass through your system. To facilitate this, it is important, unless otherwise directed, to increase your fluids for the next 24-48hrs and to resume your normal diet.  This test typically takes about 30 minutes to perform. __________________________________________________________________________________   Thank you,  Dr. Lynann Bologna

## 2023-11-28 NOTE — Progress Notes (Signed)
 Chief Complaint: Hoarseness  Referring Provider:  Andreas Blower., MD      ASSESSMENT AND PLAN;    #1. GERD with eso dysphagia. D/d includes eso stricture, Schatzki's ring, motility disorder, EoE, pill induced esophagitis, r/o eso carcinoma/extrinsic lesions or Achalasia.  #2. Horseness (neg ENT eval, neg MBS)  #3. Multiple comorbidities including A-fib on Eliquis, CHF with EF 30 to 35%, COPD with recent respiratory failure requiring intubation.  Plan: -Continue Protonix 40 mg p.o. QD, 1/2 hr before breakfast. #30.  11 refills. -Ba swallow with Ba tablet as well. -If abn, EGD with eso bx and possibly dil after cardio clearence and holding eliquis x 1 day at [facility] -I have instructed patient to chew foods especially meats and breads well and eat slowly.    Discussed risks and benefits including small but definite risks of eso perforation, bleeding, risks of anesthesia.  The benefits were also discussed. Consent forms given. HPI:    Joseph Guerra is a 78 y.o. male  With HTN, HLD. COPD, CAD with prior MI, Afib on Eliquis, CHF (EF 30-35%)  History of Present Illness Joseph Guerra is a 78 year old male with COPD who presents with mucus buildup affecting speech.  He has experienced significant mucus buildup since September, which he feels prevents him from speaking clearly, describing his speech as 'mumbling'. He also mentions a 'tongue problem' that affects his ability to articulate words. Despite evaluations by an ENT specialist, including a nasal endoscopy, no abnormalities were found. Hoarseness has been present since before September.  He has difficulty swallowing, particularly with liquids, which often leads to coughing. He tends to eat in small pieces to manage this issue. A modified barium swallow test and ENT evaluations have not identified a cause for his symptoms. He frequently spits out mucus but struggles to cough it up completely. No heartburn, and he does not feel  food comes back up often.  He has a history of COPD and was hospitalized in September and October due to elevated CO2 levels, requiring intubation and subsequent use of a BiPAP machine at night, which has helped with breathing but not with his voice issues. He takes Protonix once daily. An endoscopy in January 2023 revealed a small hiatal hernia and gastroenteritis with bacterial infection, which was treated with antibiotics. A follow-up stool test was negative for bacteria.  He has a history of atrial fibrillation and has had two heart attacks, with a stent placed previously. His heart function is noted to be reduced, with an ejection fraction of 30-35%. He is on blood thinners and has a history of smoking. 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 Bx- + HP. Treated with Quad Rx   Colon 09/04/2021:  One 7 mm sessile polyp in the hepatic flexure; completely removed en bloc by cold snare and retrieved specimen. Bx- TA Small, internal (grade 1) hemorrhoids The exam was otherwise normal to the distal terminal ileum. Performed multiple pancolonic forceps biopsies to rule out colitis Bx- collegenous colitis. Rxed with budesonide  Colonoscopy 09/2015 for SCOL : 3mm TA polyp in DC and small internal hemorrhoids.  Past Medical History:  Diagnosis Date   Arthritis associated with another disorder 12/17/2011   Atherosclerotic heart disease of native coronary artery without angina pectoris 12/17/2011   Formatting of this note might be different from the original. Myocardial infarction 2002  with apical hypokinesis Formatting of this note might be different from the original. Managed by Dr. Bing Matter   CAD (coronary artery disease)    a. prior MI in 2001 with stents placed at that time (patient unsure of which arteries)   Chronic dermatitis 10/24/2015   Collagenous colitis  10/16/2021   Coronary artery disease 12/17/2011   Overview:  Myocardial infarction 2002 with apical hypokinesis   Dyslipidemia (high LDL; low HDL) 06/18/2016   Essential hypertension 10/24/2015   History of Helicobacter pylori infection    Hypercholesteremia    Hyperlipidemia LDL goal <70    Hypertension    Hypokalemia    Ischemic cardiomyopathy    Ejection fraction 30% in June 2018   Knee pain, left 05/07/2016   PVC's (premature ventricular contractions) 03/28/2017   ST elevation myocardial infarction (STEMI) (HCC)    ST elevation myocardial infarction (STEMI) of anterolateral wall (HCC) 02/06/2017    Past Surgical History:  Procedure Laterality Date   CARDIOVERSION N/A 08/06/2023   Procedure: CARDIOVERSION (CATH LAB);  Surgeon: Chilton Si, MD;  Location[facility] INVASIVE CV LAB;  Service: Cardiovascular;  Laterality: N/A;   CORONARY ANGIOPLASTY WITH STENT PLACEMENT     CORONARY BALLOON ANGIOPLASTY N/A 02/06/2017   Procedure: Coronary Balloon Angioplasty;  Surgeon: Iran Ouch, MD;  Location: [facility] INVASIVE CV LAB;  Service: Cardiovascular;  Laterality: N/A;   LEFT HEART CATH AND CORONARY ANGIOGRAPHY N/A 02/06/2017   Procedure: Left Heart Cath and Coronary Angiography;  Surgeon: Iran Ouch, MD;  Location: [facility] INVASIVE CV LAB;  Service: Cardiovascular;  Laterality: N/A;   TRANSESOPHAGEAL ECHOCARDIOGRAM (CATH LAB) N/A 08/06/2023   Procedure: TRANSESOPHAGEAL ECHOCARDIOGRAM;  Surgeon: Chilton Si, MD;  Location: [facility] INVASIVE CV LAB;  Service: Cardiovascular;  Laterality: N/A;    Family History  Problem Relation Age of Onset   Heart disease Father    Colon cancer Neg Hx    Stomach cancer Neg Hx     Social History   Tobacco Use   Smoking status: Former   Smokeless tobacco: Never  Vaping Use   Vaping status: Never Used  Substance Use Topics   Alcohol use: Yes    Comment: occasional. Glass of wine once in awhile   Drug use: No    Current Outpatient  Medications  Medication Sig Dispense Refill   Adalimumab (HUMIRA PEN [place]) Inject 1 application  into the skin daily.     aspirin 81 MG chewable tablet Chew 1 tablet (81 mg total) by mouth daily. 30 tablet 11   atorvastatin (LIPITOR) 80 MG tablet Take 80 mg by mouth at bedtime.     Budeson-Glycopyrrol-Formoterol (BREZTRI AEROSPHERE) 160-9-4.8 MCG/ACT AERO Inhale 2 puffs into the lungs in the morning and at bedtime. 10.7 g 0   carvedilol (COREG) 12.5 MG tablet Take 1 tablet (12.5 mg total) by mouth 2 (two) times daily with a meal. 180 tablet 2   clobetasol cream (TEMOVATE) 0.05 % Apply 1 application  topically 2 (two) times daily as needed (scalp).     ELIQUIS 5 MG TABS tablet Take 5 mg by mouth 2 (two) times daily.     isosorbide mononitrate (IMDUR) 30 MG 24 hr tablet Take 1 tablet by mouth once daily (Patient taking differently: Take 30 mg by mouth daily.) 90 tablet 1   levETIRAcetam (KEPPRA) 500 MG tablet Take 500 mg by mouth 2 (two) times daily.     losartan (COZAAR) 25 MG tablet Take 1 tablet (25 mg total) by mouth daily. 90 tablet  3   pantoprazole (PROTONIX) 40 MG tablet Take 1 tablet by mouth once daily 90 tablet 0   Spacer/Aero-Holding Chambers (AEROCHAMBER MV) inhaler Use as instructed (Patient taking differently: 1 each by Other route See admin instructions. Use as instructed) 1 each 0   Vitamin D, Ergocalciferol, (DRISDOL) 1.25 MG (50000 UNIT) CAPS capsule Take 50,000 Units by mouth once a week.     fluticasone (FLONASE) 50 MCG/ACT nasal spray Place 1 spray into both nostrils daily. (Patient not taking: Reported on 11/28/2023) 16 g 11   furosemide (LASIX) 20 MG tablet Take 1 tablet (20 mg total) by mouth daily. (Patient not taking: Reported on 11/28/2023) 90 tablet 3   ipratropium-albuterol (DUONEB) 0.5-2.5 (3) MG/3ML SOLN Inhale 3 mLs into the lungs every 6 (six) hours as needed (SOB). (Patient not taking: Reported on 11/28/2023)     No current facility-administered medications for this visit.    Facility-Administered Medications Ordered in Other Visits  Medication Dose Route Frequency Provider Last Rate Last Admin   technetium tetrofosmin (TC-MYOVIEW) injection 30 millicurie  30 millicurie Intravenous Once PRN Wendall Stade, MD        No Known Allergies  Review of Systems:  Constitutional: Denies fever, chills, diaphoresis, appetite change and fatigue.  HEENT: Denies photophobia, eye pain, redness, hearing loss, ear pain, congestion, sore throat, rhinorrhea, sneezing, mouth sores, neck pain, neck stiffness and tinnitus.   Respiratory: Denies SOB, DOE, cough, chest tightness,  and wheezing.   Cardiovascular: Denies chest pain, palpitations and leg swelling.  Genitourinary: Denies dysuria, urgency, frequency, hematuria, flank pain and difficulty urinating.  Musculoskeletal: Denies myalgias, back pain, joint swelling, arthralgias and gait problem.  Skin: No rash.  Neurological: Denies dizziness, seizures, syncope, weakness, light-headedness, numbness and headaches.  Hematological: Denies adenopathy. Easy bruising, personal or family bleeding history  Psychiatric/Behavioral: No anxiety or depression     Physical Exam:    BP 124/70   Pulse 61   Ht 5\' 9"  (1.753 m)   Wt 165 lb (74.8 kg)   BMI 24.37 kg/m  Wt Readings from Last 3 Encounters:  11/28/23 165 lb (74.8 kg)  09/10/23 165 lb (74.8 kg)  08/06/23 165 lb (74.8 kg)   Constitutional:  Well-developed, in no acute distress. Psychiatric: Normal mood and affect. Behavior is normal. HEENT: Pupils normal.  Conjunctivae are normal. No scleral icterus. Neck supple.  Cardiovascular: Normal rate, regular rhythm. No edema Pulmonary/chest: Effort normal and breath sounds normal. No wheezing, rales or rhonchi. Abdominal: Soft, nondistended. Nontender. Bowel sounds active throughout. There are no masses palpable. No hepatomegaly. Rectal: Deferred Neurological: Alert and oriented to person place and time. Skin: Skin is warm and  dry. No rashes noted.  Data Reviewed: I have personally reviewed following labs and imaging studies  CBC:    Latest Ref Rng & Units 08/06/2023   10:00 AM 07/09/2023   11:35 AM 12/16/2017   11:40 AM  CBC  WBC 3.4 - 10.8 x10E3/uL  6.1  5.5   Hemoglobin 13.0 - 17.0 g/dL 65.7  84.6  96.2   Hematocrit 39.0 - 52.0 % 40.0  42.8  40.0   Platelets 150 - 450 x10E3/uL  153  218     CMP:    Latest Ref Rng & Units 09/20/2023    9:55 AM 08/06/2023   10:00 AM 06/26/2023   10:23 AM  CMP  Glucose 70 - 99 mg/dL 84  92  86   BUN 8 - 27 mg/dL 19  16  15  Creatinine 0.76 - 1.27 mg/dL 1.61  0.96  0.45   Sodium 134 - 144 mmol/L 144  141  143   Potassium 3.5 - 5.2 mmol/L 4.3  3.8  4.8   Chloride 96 - 106 mmol/L 101  98  100   CO2 20 - 29 mmol/L 28   30   Calcium 8.6 - 10.2 mg/dL 9.5   9.4     GFR: CrCl cannot be calculated (Patient's most recent lab result is older than the maximum 21 days allowed.). Liver Function Tests: No results for input(s): "AST", "ALT", "ALKPHOS", "BILITOT", "PROT", "ALBUMIN" in the last 168 hours. No results for input(s): "LIPASE", "AMYLASE" in the last 168 hours. No results for input(s): "AMMONIA" in the last 168 hours. Coagulation Profile: No results for input(s): "INR", "PROTIME" in the last 168 hours. HbA1C: No results for input(s): "HGBA1C" in the last 72 hours. Lipid Profile: No results for input(s): "CHOL", "HDL", "LDLCALC", "TRIG", "CHOLHDL", "LDLDIRECT" in the last 72 hours. Thyroid Function Tests: No results for input(s): "TSH", "T4TOTAL", "FREET4", "T3FREE", "THYROIDAB" in the last 72 hours. Anemia Panel: No results for input(s): "VITAMINB12", "FOLATE", "FERRITIN", "TIBC", "IRON", "RETICCTPCT" in the last 72 hours.  No results found for this or any previous visit (from the past 240 hours).    Radiology Studies: No results found.    Edman Circle, MD 11/28/2023, 3:20 PM  Cc: Andreas Blower., MD

## 2023-12-04 ENCOUNTER — Ambulatory Visit: Payer: Medicare Other | Attending: Cardiology | Admitting: Cardiology

## 2023-12-04 ENCOUNTER — Encounter: Payer: Self-pay | Admitting: Cardiology

## 2023-12-04 VITALS — BP 160/90 | HR 85 | Ht 69.0 in | Wt 166.0 lb

## 2023-12-04 DIAGNOSIS — I2510 Atherosclerotic heart disease of native coronary artery without angina pectoris: Secondary | ICD-10-CM | POA: Diagnosis not present

## 2023-12-04 DIAGNOSIS — I480 Paroxysmal atrial fibrillation: Secondary | ICD-10-CM | POA: Diagnosis not present

## 2023-12-04 DIAGNOSIS — E785 Hyperlipidemia, unspecified: Secondary | ICD-10-CM

## 2023-12-04 DIAGNOSIS — I10 Essential (primary) hypertension: Secondary | ICD-10-CM | POA: Diagnosis not present

## 2023-12-04 MED ORDER — NITROGLYCERIN 0.4 MG SL SUBL: 0.4000 mg | SUBLINGUAL_TABLET | SUBLINGUAL | 3 refills | Status: AC | PRN

## 2023-12-04 NOTE — Progress Notes (Unsigned)
 Cardiology Office Note:    Date:  12/04/2023   ID:  Joseph Guerra, DOB Aug 30, 1945, MRN [id number]  PCP:  Andreas Blower., MD  Cardiologist:  Gypsy Balsam, MD    Referring MD: Andreas Blower., MD   Chief Complaint  Patient presents with   Medication Refill    Nitroglycerin    History of Present Illness:    Joseph Guerra is a 78 y.o. male  with past medical history significant for coronary artery disease. In 2001 he suffered from myocardial infarction he required stent to LAD. He had another event in 2019 he does have ischemic cardiomyopathy with ejection fraction 30% and then with guideline directed medical therapy Therapy improvement to 35 to 40% last check in June 2024. 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 He also have some difficulty swallowing extensive workup has been negative. Recently he ended up in the hospital because of respiratory failure required intubation spent 25 hours on the rest. And after that extubated. He was also found to be in atrial fibrillation. Anticoagulation has been initiated with 5 mg Eliquis twice daily and I brought him back to my office  Since that time he did have transesophageal echocardiogram to confirm ejection fraction 30 to 35%, no thrombus, cardioversion has been done he comes today to months Comes today to months of follow-up overall doing well.  Denies have any chest pain tightness squeezing pressure burning chest he looks will be more energetic still speech is a problem  Past Medical History:  Diagnosis Date   Arthritis associated with another disorder 12/17/2011   Atherosclerotic heart disease of native coronary artery without angina pectoris 12/17/2011   Formatting of this note might be different from the original. Myocardial infarction 2002 with apical hypokinesis  Formatting of this note might be different from the original. Managed by Dr. Bing Matter   CAD (coronary artery disease)    a. prior MI in 2001 with stents placed at that time (patient unsure of which arteries)   Chronic dermatitis 10/24/2015   Collagenous colitis 10/16/2021   Coronary artery disease 12/17/2011   Overview:  Myocardial infarction 2002 with apical hypokinesis   Dyslipidemia (high LDL; low HDL) 06/18/2016   Essential hypertension 10/24/2015   History of Helicobacter pylori infection    Hypercholesteremia    Hyperlipidemia LDL goal <70    Hypertension    Hypokalemia    Ischemic cardiomyopathy    Ejection fraction 30% in June 2018   Knee pain, left 05/07/2016   PVC's (premature ventricular contractions) 03/28/2017   ST elevation myocardial infarction (STEMI) (HCC)    ST elevation myocardial infarction (STEMI) of anterolateral wall (HCC) 02/06/2017    Past Surgical History:  Procedure Laterality Date   CARDIOVERSION N/A 08/06/2023   Procedure: CARDIOVERSION (CATH LAB);  Surgeon: Chilton Si, MD;  [facility]: [facility] INVASIVE CV LAB;  Service: Cardiovascular;  Laterality: N/A;   CORONARY ANGIOPLASTY WITH STENT PLACEMENT     CORONARY BALLOON ANGIOPLASTY N/A 02/06/2017   Procedure: Coronary Balloon Angioplasty;  Surgeon: Iran Ouch, MD;  Location: [facility] INVASIVE CV LAB;  Service: Cardiovascular;  Laterality: N/A;   LEFT HEART CATH AND CORONARY ANGIOGRAPHY N/A 02/06/2017   Procedure: Left Heart Cath and Coronary Angiography;  Surgeon: Iran Ouch, MD;  Location: [facility] INVASIVE CV LAB;  Service: Cardiovascular;  Laterality: N/A;   TRANSESOPHAGEAL ECHOCARDIOGRAM (  CATH LAB) N/A 08/06/2023   Procedure: TRANSESOPHAGEAL ECHOCARDIOGRAM;  Surgeon: Chilton Si, MD;  Location: [facility] INVASIVE CV LAB;  Service: Cardiovascular;  Laterality: N/A;    Current Medications: Current Meds  Medication Sig   Adalimumab (HUMIRA PEN ) Inject 1 application  into the skin daily.   aspirin  81 MG chewable tablet Chew 1 tablet (81 mg total) by mouth daily.   atorvastatin (LIPITOR) 80 MG tablet Take 80 mg by mouth at bedtime.   Budeson-Glycopyrrol-Formoterol (BREZTRI AEROSPHERE) 160-9-4.8 MCG/ACT AERO Inhale 2 puffs into the lungs in the morning and at bedtime.   carvedilol (COREG) 12.5 MG tablet Take 1 tablet (12.5 mg total) by mouth 2 (two) times daily with a meal.   clobetasol cream (TEMOVATE) 0.05 % Apply 1 application  topically 2 (two) times daily as needed (scalp).   ELIQUIS 5 MG TABS tablet Take 5 mg by mouth 2 (two) times daily.   ferrous sulfate 325 (65 FE) MG tablet Take 325 mg by mouth 2 (two) times daily with a meal.   fluticasone (FLONASE) 50 MCG/ACT nasal spray Place 1 spray into both nostrils daily.   furosemide (LASIX) 20 MG tablet Take 1 tablet (20 mg total) by mouth daily.   ipratropium-albuterol (DUONEB) 0.5-2.5 (3) MG/3ML SOLN Inhale 3 mLs into the lungs every 6 (six) hours as needed (SOB).   isosorbide mononitrate (IMDUR) 30 MG 24 hr tablet Take 1 tablet by mouth once daily (Patient taking differently: Take 30 mg by mouth daily.)   levETIRAcetam (KEPPRA) 500 MG tablet Take 500 mg by mouth 2 (two) times daily.   losartan (COZAAR) 25 MG tablet Take 1 tablet (25 mg total) by mouth daily.   pantoprazole (PROTONIX) 40 MG tablet Take 1 tablet by mouth once daily   Spacer/Aero-Holding Chambers (AEROCHAMBER MV) inhaler Use as instructed (Patient taking differently: 1 each by Other route See admin instructions. Use as instructed)   Vitamin D, Ergocalciferol, (DRISDOL) 1.25 MG (50000 UNIT) CAPS capsule Take 50,000 Units by mouth once a week.     Allergies:   Patient has no known allergies.   Social History   Socioeconomic History   Marital status: Married    Spouse name: Not on file   Number of children: 2   Years of education: Not on file   Highest education level: Not on file  Occupational History   Occupation: retired  Tobacco Use   Smoking status: Former    Smokeless tobacco: Never  Advertising account planner   Vaping status: Never Used  Substance and Sexual Activity   Alcohol use: Yes    Comment: occasional. Glass of wine once in awhile   Drug use: No   Sexual activity: Not on file  Other Topics Concern   Not on file  Social History Narrative   Right Handed    Lives in a one story home with his wife.    Social Drivers of Corporate investment banker Strain: Not on file  Food Insecurity: Low Risk  (09/03/2023)   Received from [facility]   Hunger Vital Sign    Worried About Running Out of Food in the Last Year: Never true    Ran Out of Food in the Last Year: Never true  Transportation Needs: No Transportation Needs (09/03/2023)   Received from [facility]    In the past 12 months, has lack of reliable transportation kept you from medical appointments, meetings, work or from getting things needed for daily living? :  No  Physical Activity: Not on file  Stress: Not on file  Social Connections: Not on file     Family History: The patient's family history includes Heart disease in his father. There is no history of Colon cancer or Stomach cancer. ROS:   Please see the history of present illness.    All 14 point review of systems negative except as described per history of present illness  EKGs/Labs/Other Studies Reviewed:         Recent Labs: 06/17/2023: NT-Pro BNP 765 07/09/2023: Platelets 153 08/06/2023: Hemoglobin 13.6 09/20/2023: BUN 19; Creatinine, Ser 0.73; Potassium 4.3; Sodium 144  Recent Lipid Panel    Component Value Date/Time   CHOL 126 02/07/2023 0919   TRIG 62 02/07/2023 0919   HDL 69 02/07/2023 0919   CHOLHDL 1.8 02/07/2023 0919   CHOLHDL 3.0 02/07/2017 0236   VLDL 15 02/07/2017 0236   LDLCALC 44 02/07/2023 0919    Physical Exam:    VS:  BP (!) 160/90 (BP Location: Right Arm, Patient Position: Sitting)   Pulse 85   Ht 5\' 9"  (1.753 m)   Wt 166 lb (75.3 kg)   SpO2 99%   BMI 24.51 kg/m     Wt  Readings from Last 3 Encounters:  12/04/23 166 lb (75.3 kg)  11/28/23 165 lb (74.8 kg)  09/10/23 165 lb (74.8 kg)     GEN:  Well nourished, well developed in no acute distress HEENT: Normal NECK: No JVD; No carotid bruits LYMPHATICS: No lymphadenopathy CARDIAC: RRR, no murmurs, no rubs, no gallops RESPIRATORY:  Clear to auscultation without rales, wheezing or rhonchi  ABDOMEN: Soft, non-tender, non-distended MUSCULOSKELETAL:  No edema; No deformity  SKIN: Warm and dry LOWER EXTREMITIES: no swelling NEUROLOGIC:  Alert and oriented x 3 PSYCHIATRIC:  Normal affect   ASSESSMENT:    1. Paroxysmal atrial fibrillation (HCC)   2. Coronary artery disease involving native coronary artery of native heart without angina pectoris   3. Dyslipidemia (high LDL; low HDL)    PLAN:    In order of problems listed above:  Cardiomyopathy ischemic in origin.  I will double the dose of losartan will check Chem-7 and hopefully Will he be here which will be a month switch him to [facility] we will keep him on guideline stated medical therapy will repeat echocardiogram there is improvement if not we will consider ICD. Coronary disease denies having any issue with chest pain tightness squeezing pressure burning chest. Dyslipidemia I did review K PN which show me LDL 44 HDL 69 continue present management. Carotic arterial disease Will consider doing cardiac ultrasound   Medication Adjustments/Labs and Tests Ordered: Current medicines are reviewed at length with the patient today.  Concerns regarding medicines are outlined above.  No orders of the defined types were placed in this encounter.  Medication changes: No orders of the defined types were placed in this encounter.   Signed, Georgeanna Lea, MD, [facility] 12/04/2023 4:41 PM    [facility]

## 2023-12-04 NOTE — Patient Instructions (Addendum)
 Medication Instructions:  Your physician recommends that you continue on your current medications as directed. Please refer to the Current Medication list given to you today.  *If you need a refill on your cardiac medications before your next appointment, please call your pharmacy*   Lab Work: 3rd Floor   [address]  Your physician recommends that you return for lab work in:  1 month before appt    You can come Monday through Friday 8:00 am to 11:30AM and 1:00 to 4:00. You do not need to make an appointment as the order has already been placed.   Testing/Procedures: None Ordered   Follow-Up: At [facility], you and your health needs are our priority.  As part of our continuing mission to provide you with exceptional heart care, we have created designated Provider Care Teams.  These Care Teams include your primary Cardiologist (physician) and Advanced Practice Providers (APPs -  Physician Assistants and Nurse Practitioners) who all work together to provide you with the care you need, when you need it.  We recommend signing up for the patient portal called "MyChart".  Sign up information is provided on this After Visit Summary.  MyChart is used to connect with patients for Virtual Visits (Telemedicine).  Patients are able to view lab/test results, encounter notes, upcoming appointments, etc.  Non-urgent messages can be sent to your provider as well.   To learn more about what you can do with MyChart, go to [email or web address].    Your next appointment:   1 month(s)  The format for your next appointment:   In Person  Provider:   Gypsy Balsam, MD    Other Instructions NA

## 2023-12-07 ENCOUNTER — Encounter: Payer: Self-pay | Admitting: Cardiology

## 2023-12-10 ENCOUNTER — Ambulatory Visit (HOSPITAL_COMMUNITY)
Admission: RE | Admit: 2023-12-10 | Discharge: 2023-12-10 | Disposition: A | Discharge status: Home / Self Care | Source: Ambulatory Visit | Attending: Gastroenterology | Admitting: Gastroenterology

## 2023-12-10 DIAGNOSIS — R490 Dysphonia: Secondary | ICD-10-CM | POA: Insufficient documentation

## 2023-12-10 DIAGNOSIS — K219 Gastro-esophageal reflux disease without esophagitis: Secondary | ICD-10-CM | POA: Insufficient documentation

## 2023-12-10 DIAGNOSIS — R1319 Other dysphagia: Secondary | ICD-10-CM | POA: Insufficient documentation

## 2023-12-18 ENCOUNTER — Other Ambulatory Visit: Payer: Self-pay | Admitting: Cardiology

## 2023-12-18 DIAGNOSIS — I2510 Atherosclerotic heart disease of native coronary artery without angina pectoris: Secondary | ICD-10-CM

## 2023-12-18 DIAGNOSIS — I10 Essential (primary) hypertension: Secondary | ICD-10-CM

## 2023-12-18 NOTE — Telephone Encounter (Signed)
 Prescription sent to pharmacy.

## 2024-01-14 LAB — BASIC METABOLIC PANEL WITH GFR
BUN/Creatinine Ratio: 20 (ref 10–24)
BUN: 14 mg/dL (ref 8–27)
CO2: 27 mmol/L (ref 20–29)
Calcium: 9.5 mg/dL (ref 8.6–10.2)
Chloride: 102 mmol/L (ref 96–106)
Creatinine, Ser: 0.7 mg/dL — ABNORMAL LOW (ref 0.76–1.27)
Glucose: 91 mg/dL (ref 70–99)
Potassium: 4.4 mmol/L (ref 3.5–5.2)
Sodium: 144 mmol/L (ref 134–144)
eGFR: 96 mL/min/{1.73_m2} (ref >59)

## 2024-01-15 ENCOUNTER — Encounter: Payer: Self-pay | Admitting: Cardiology

## 2024-01-16 ENCOUNTER — Ambulatory Visit: Payer: Self-pay | Admitting: Cardiology

## 2024-01-17 ENCOUNTER — Ambulatory Visit: Attending: Cardiology | Admitting: Cardiology

## 2024-01-17 ENCOUNTER — Encounter: Payer: Self-pay | Admitting: Cardiology

## 2024-01-17 VITALS — BP 136/78 | HR 52 | Ht 69.0 in | Wt 158.0 lb

## 2024-01-17 DIAGNOSIS — R0609 Other forms of dyspnea: Secondary | ICD-10-CM

## 2024-01-17 DIAGNOSIS — I480 Paroxysmal atrial fibrillation: Secondary | ICD-10-CM

## 2024-01-17 DIAGNOSIS — E785 Hyperlipidemia, unspecified: Secondary | ICD-10-CM | POA: Diagnosis not present

## 2024-01-17 DIAGNOSIS — I2510 Atherosclerotic heart disease of native coronary artery without angina pectoris: Secondary | ICD-10-CM | POA: Diagnosis not present

## 2024-01-17 DIAGNOSIS — I6521 Occlusion and stenosis of right carotid artery: Secondary | ICD-10-CM

## 2024-01-17 MED ORDER — ENTRESTO 24-26 MG PO TABS: 1.0000 | ORAL_TABLET | Freq: Two times a day (BID) | ORAL | 3 refills | Status: DC

## 2024-01-17 NOTE — Patient Instructions (Addendum)
 Medication Instructions:   Slowly stop Losartan   START: Entresto 24-26 1 tablet twice daily   Lab Work: None Ordered If you have labs (blood work) drawn today and your tests are completely normal, you will receive your results only by: MyChart Message (if you have MyChart) OR A paper copy in the mail If you have any lab test that is abnormal or we need to change your treatment, we will call you to review the results.   Testing/Procedures: 1.5 months Your physician has requested that you have an echocardiogram. Echocardiography is a painless test that uses sound waves to create images of your heart. It provides your doctor with information about the size and shape of your heart and how well your heart's chambers and valves are working. This procedure takes approximately one hour. There are no restrictions for this procedure. Please do NOT wear cologne, perfume, aftershave, or lotions (deodorant is allowed). Please arrive 15 minutes prior to your appointment time.  Please note: We ask at that you not bring children with you during ultrasound (echo/ vascular) testing. Due to room size and safety concerns, children are not allowed in the ultrasound rooms during exams. Our front office staff cannot provide observation of children in our lobby area while testing is being conducted. An adult accompanying a patient to their appointment will only be allowed in the ultrasound room at the discretion of the ultrasound technician under special circumstances. We apologize for any inconvenience.    Follow-Up: At [facility], you and your health needs are our priority.  As part of our continuing mission to provide you with exceptional heart care, we have created designated Provider Care Teams.  These Care Teams include your primary Cardiologist (physician) and Advanced Practice Providers (APPs -  Physician Assistants and Nurse Practitioners) who all work together to provide you with the care you need,  when you need it.  We recommend signing up for the patient portal called "MyChart".  Sign up information is provided on this After Visit Summary.  MyChart is used to connect with patients for Virtual Visits (Telemedicine).  Patients are able to view lab/test results, encounter notes, upcoming appointments, etc.  Non-urgent messages can be sent to your provider as well.   To learn more about what you can do with MyChart, go to [email or web address].    Your next appointment:   3 month(s)  The format for your next appointment:   In Person  Provider:   Ralene Burger, MD    Other Instructions NA

## 2024-01-17 NOTE — Progress Notes (Unsigned)
 Cardiology Office Note:    Date:  01/17/2024   ID:  Joseph Guerra, DOB [birth year], MRN [id number]  PCP:  Lavenia Post., MD  Cardiologist:  Ralene Burger, MD    Referring MD: Lavenia Post., MD   Chief Complaint  Patient presents with   Follow-up    History of Present Illness:    Joseph Guerra is a 78 y.o. male  with past medical history significant for coronary artery disease. In 2001 he suffered from myocardial infarction he required stent to LAD. He had another event in 2019 he does have ischemic cardiomyopathy with ejection fraction 30% and then with guideline directed medical therapy Therapy improvement to 35 to 40% last check in June 2024. 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 He also have some difficulty swallowing extensive workup has been negative. Recently he ended up in the hospital because of respiratory failure required intubation spent 25 hours on the rest. And after that extubated. He was also found to be in atrial fibrillation. Anticoagulation has been initiated with 5 mg Eliquis twice daily and I brought him back to my office  Comes today to months for follow-up overall seems to be doing well.  Denies have any chest pain tightness squeezing pressure burning chest.  He does have some garden work in the garden a little no palpitations  Past Medical History:  Diagnosis Date   Arthritis associated with another disorder 12/17/2011   Atherosclerotic heart disease of native coronary artery without angina pectoris 12/17/2011   Formatting of this note might be different from the original. Myocardial infarction 2002 with apical hypokinesis Formatting of this note might be different from the original. Managed by Dr. Gordan Latina   CAD (coronary artery disease)    a. prior MI in 2001 with stents placed at  that time (patient unsure of which arteries)   Chronic dermatitis 10/24/2015   Collagenous colitis 10/16/2021   Coronary artery disease 12/17/2011   Overview:  Myocardial infarction 2002 with apical hypokinesis   Dyslipidemia (high LDL; low HDL) 06/18/2016   Essential hypertension 10/24/2015   History of Helicobacter pylori infection    Hypercholesteremia    Hyperlipidemia LDL goal <70    Hypertension    Hypokalemia    Ischemic cardiomyopathy    Ejection fraction 30% in June 2018   Knee pain, left 05/07/2016   PVC's (premature ventricular contractions) 03/28/2017   ST elevation myocardial infarction (STEMI) (HCC)    ST elevation myocardial infarction (STEMI) of anterolateral wall (HCC) 02/06/2017    Past Surgical History:  Procedure Laterality Date   CARDIOVERSION N/A 08/06/2023   Procedure: CARDIOVERSION (CATH LAB);  Surgeon: Maudine Sos, MD;  Location: [facility] INVASIVE CV LAB;  Service: Cardiovascular;  Laterality: N/A;   CORONARY ANGIOPLASTY WITH STENT PLACEMENT     CORONARY BALLOON ANGIOPLASTY N/A 02/06/2017   Procedure: Coronary Balloon Angioplasty;  Surgeon: Wenona Hamilton, MD;  Location: [facility] INVASIVE CV LAB;  Service: Cardiovascular;  Laterality: N/A;   LEFT HEART CATH AND CORONARY ANGIOGRAPHY N/A 02/06/2017   Procedure: Left Heart Cath and Coronary Angiography;  Surgeon: Wenona Hamilton, MD;  Location: [facility] INVASIVE CV LAB;  Service: Cardiovascular;  Laterality: N/A;   TRANSESOPHAGEAL ECHOCARDIOGRAM (CATH LAB) N/A 08/06/2023   Procedure: TRANSESOPHAGEAL ECHOCARDIOGRAM;  Surgeon: Maudine Sos, MD;  Location: [facility] INVASIVE CV LAB;  Service: Cardiovascular;  Laterality:  N/A;    Current Medications: Current Meds  Medication Sig   Adalimumab (HUMIRA PEN ) Inject 1 application  into the skin daily.   aspirin  81 MG chewable tablet Chew 1 tablet (81 mg total) by mouth daily.   atorvastatin  (LIPITOR ) 80 MG tablet Take 80 mg by mouth at bedtime.    Budeson-Glycopyrrol-Formoterol (BREZTRI  AEROSPHERE) 160-9-4.8 MCG/ACT AERO Inhale 2 puffs into the lungs in the morning and at bedtime.   carvedilol  (COREG ) 12.5 MG tablet Take 1 tablet (12.5 mg total) by mouth 2 (two) times daily with a meal.   clobetasol cream (TEMOVATE) 0.05 % Apply 1 application  topically 2 (two) times daily as needed (scalp).   ELIQUIS 5 MG TABS tablet Take 5 mg by mouth 2 (two) times daily.   ferrous sulfate 325 (65 FE) MG tablet Take 325 mg by mouth 2 (two) times daily with a meal.   fluticasone  (FLONASE ) 50 MCG/ACT nasal spray Place 1 spray into both nostrils daily.   furosemide  (LASIX ) 20 MG tablet Take 1 tablet (20 mg total) by mouth daily.   ipratropium-albuterol  (DUONEB) 0.5-2.5 (3) MG/3ML SOLN Inhale 3 mLs into the lungs every 6 (six) hours as needed (SOB).   isosorbide  mononitrate (IMDUR ) 30 MG 24 hr tablet Take 1 tablet (30 mg total) by mouth daily.   levETIRAcetam (KEPPRA) 500 MG tablet Take 500 mg by mouth 2 (two) times daily.   losartan  (COZAAR ) 25 MG tablet Take 1 tablet (25 mg total) by mouth daily.   nitroGLYCERIN  (NITROSTAT ) 0.4 MG SL tablet Place 1 tablet (0.4 mg total) under the tongue every 5 (five) minutes as needed for chest pain.   pantoprazole  (PROTONIX ) 40 MG tablet Take 1 tablet by mouth once daily   sertraline (ZOLOFT) 25 MG tablet Take 1 tablet by mouth daily.   Spacer/Aero-Holding Chambers (AEROCHAMBER MV) inhaler Use as instructed (Patient taking differently: 1 each by Other route See admin instructions. Use as instructed)   Vitamin D, Ergocalciferol, (DRISDOL) 1.25 MG (50000 UNIT) CAPS capsule Take 50,000 Units by mouth once a week.     Allergies:   Patient has no known allergies.   Social History   Socioeconomic History   Marital status: Married    Spouse name: Not on file   Number of children: 2   Years of education: Not on file   Highest education level: Not on file  Occupational History   Occupation: retired  Tobacco Use    Smoking status: Former   Smokeless tobacco: Never  Advertising account planner   Vaping status: Never Used  Substance and Sexual Activity   Alcohol use: Yes    Comment: occasional. Glass of wine once in awhile   Drug use: No   Sexual activity: Not on file  Other Topics Concern   Not on file  Social History Narrative   Right Handed    Lives in a one story home with his wife.    Social Drivers of Corporate investment banker Strain: Not on file  Food Insecurity: Low Risk  (09/03/2023)   Received from [facility]   Hunger Vital Sign    Worried About Running Out of Food in the Last Year: Never true    Ran Out of Food in the Last Year: Never true  Transportation Needs: No Transportation Needs (09/03/2023)   Received from [employer]    In the past 12 months, has lack of reliable transportation kept you from medical appointments, meetings, work or from  getting things needed for daily living? : No  Physical Activity: Not on file  Stress: Not on file  Social Connections: Not on file     Family History: The patient's family history includes Heart disease in his father. There is no history of Colon cancer or Stomach cancer. ROS:   Please see the history of present illness.    All 14 point review of systems negative except as described per history of present illness  EKGs/Labs/Other Studies Reviewed:         Recent Labs: 06/17/2023: NT-Pro BNP 765 07/09/2023: Platelets 153 08/06/2023: Hemoglobin 13.6 01/13/2024: BUN 14; Creatinine, Ser 0.70; Potassium 4.4; Sodium 144  Recent Lipid Panel    Component Value Date/Time   CHOL 126 02/07/2023 0919   TRIG 62 02/07/2023 0919   HDL 69 02/07/2023 0919   CHOLHDL 1.8 02/07/2023 0919   CHOLHDL 3.0 02/07/2017 0236   VLDL 15 02/07/2017 0236   LDLCALC 44 02/07/2023 0919    Physical Exam:    VS:  BP 136/78 (BP Location: Right Arm, Patient Position: Sitting)   Pulse (!) 52   Ht 5\' 9"  (1.753 m)   Wt 158 lb (71.7 kg)   SpO2 96%    BMI 23.33 kg/m     Wt Readings from Last 3 Encounters:  01/17/24 158 lb (71.7 kg)  12/04/23 166 lb (75.3 kg)  11/28/23 165 lb (74.8 kg)     GEN:  Well nourished, well developed in no acute distress HEENT: Normal NECK: No JVD; No carotid bruits LYMPHATICS: No lymphadenopathy CARDIAC: RRR, no murmurs, no rubs, no gallops RESPIRATORY:  Clear to auscultation without rales, wheezing or rhonchi  ABDOMEN: Soft, non-tender, non-distended MUSCULOSKELETAL:  No edema; No deformity  SKIN: Warm and dry LOWER EXTREMITIES: no swelling NEUROLOGIC:  Alert and oriented x 3 PSYCHIATRIC:  Normal affect   ASSESSMENT:    1. Atherosclerosis of native coronary artery of native heart without angina pectoris   2. Paroxysmal atrial fibrillation (HCC)   3. Carotid stenosis, asymptomatic, right   4. Dyslipidemia (high LDL; low HDL)    PLAN:    In order of problems listed above:  Cardiomyopathy.  Will stop losartan  will put him on Entresto 24-26, Chem-7 will be checked next week.  I schedule him to have another echocardiogram in about 1-1/25-month to see if there is improvement with known consider ICD. Paroxysmal atrial fibrillation anticoagulated denies have any palpitation maintained sinus rhythm. Carotic artery stenosis Will see him with repeat carotid ultrasound. Dyslipidemia I did review K PN he is LDL 44 HDL 69 good control continue present management.   Medication Adjustments/Labs and Tests Ordered: Current medicines are reviewed at length with the patient today.  Concerns regarding medicines are outlined above.  No orders of the defined types were placed in this encounter.  Medication changes: No orders of the defined types were placed in this encounter.   Signed, Manfred Seed, MD, [facility] 01/17/2024 4:52 PM    [facility]

## 2024-01-28 ENCOUNTER — Telehealth: Payer: Self-pay

## 2024-01-28 DIAGNOSIS — I10 Essential (primary) hypertension: Secondary | ICD-10-CM

## 2024-01-28 NOTE — Telephone Encounter (Signed)
 BPM after starting Entresto  per Dr. Krasowski

## 2024-01-29 LAB — BASIC METABOLIC PANEL WITH GFR
BUN/Creatinine Ratio: 21 (ref 10–24)
BUN: 15 mg/dL (ref 8–27)
CO2: 26 mmol/L (ref 20–29)
Calcium: 9.4 mg/dL (ref 8.6–10.2)
Chloride: 100 mmol/L (ref 96–106)
Creatinine, Ser: 0.71 mg/dL — ABNORMAL LOW (ref 0.76–1.27)
Glucose: 82 mg/dL (ref 70–99)
Potassium: 4.4 mmol/L (ref 3.5–5.2)
Sodium: 141 mmol/L (ref 134–144)
eGFR: 96 mL/min/{1.73_m2} (ref >59)

## 2024-02-03 ENCOUNTER — Ambulatory Visit: Payer: Self-pay | Admitting: Cardiology

## 2024-02-25 ENCOUNTER — Other Ambulatory Visit (HOSPITAL_BASED_OUTPATIENT_CLINIC_OR_DEPARTMENT_OTHER)

## 2024-03-03 ENCOUNTER — Other Ambulatory Visit (HOSPITAL_BASED_OUTPATIENT_CLINIC_OR_DEPARTMENT_OTHER)

## 2024-03-05 ENCOUNTER — Ambulatory Visit (HOSPITAL_BASED_OUTPATIENT_CLINIC_OR_DEPARTMENT_OTHER)
Admission: RE | Admit: 2024-03-05 | Discharge: 2024-03-05 | Disposition: A | Discharge status: Home / Self Care | Source: Ambulatory Visit | Attending: Cardiology | Admitting: Cardiology

## 2024-03-05 DIAGNOSIS — R0609 Other forms of dyspnea: Secondary | ICD-10-CM | POA: Diagnosis present

## 2024-03-05 LAB — ECHOCARDIOGRAM COMPLETE
AR max vel: 2.49 cm2
AV Area VTI: 2.1 cm2
AV Area mean vel: 2.22 cm2
AV Mean grad: 6 mmHg
AV Peak grad: 11.7 mmHg
Ao pk vel: 1.71 m/s
Area-P 1/2: 5.31 cm2
Calc EF: 40.9 %
S' Lateral: 3.9 cm
Single Plane A2C EF: 40.6 %
Single Plane A4C EF: 40.9 %

## 2024-03-05 MED ORDER — PERFLUTREN LIPID MICROSPHERE
1.0000 mL | INTRAVENOUS | Status: AC | PRN
  Administered 2024-03-05: 2 mL via INTRAVENOUS

## 2024-03-13 ENCOUNTER — Ambulatory Visit: Payer: Self-pay | Admitting: Cardiology

## 2024-03-17 ENCOUNTER — Telehealth: Payer: Self-pay

## 2024-03-17 NOTE — Telephone Encounter (Signed)
 Left message on My Chart with normal results per Dr. Vanetta Shawl note. Routed to PCP.

## 2024-03-24 ENCOUNTER — Ambulatory Visit: Admitting: Cardiology

## 2024-04-05 ENCOUNTER — Other Ambulatory Visit: Payer: Self-pay | Admitting: Cardiology

## 2024-04-20 ENCOUNTER — Encounter: Payer: Self-pay | Admitting: Cardiology

## 2024-04-20 ENCOUNTER — Ambulatory Visit: Attending: Cardiology | Admitting: Cardiology

## 2024-04-20 VITALS — BP 126/70 | HR 68 | Resp 18 | Ht 69.0 in | Wt 149.0 lb

## 2024-04-20 DIAGNOSIS — I2510 Atherosclerotic heart disease of native coronary artery without angina pectoris: Secondary | ICD-10-CM

## 2024-04-20 DIAGNOSIS — E785 Hyperlipidemia, unspecified: Secondary | ICD-10-CM

## 2024-04-20 DIAGNOSIS — I10 Essential (primary) hypertension: Secondary | ICD-10-CM

## 2024-04-20 DIAGNOSIS — I255 Ischemic cardiomyopathy: Secondary | ICD-10-CM | POA: Diagnosis not present

## 2024-04-20 NOTE — Progress Notes (Signed)
 Cardiology Office Note:    Date:  04/20/2024   ID:  Joseph Guerra, DOB 1946-01-25, MRN [id number]  PCP:  Delilah Murray HERO., MD  Cardiologist:  Lamar Fitch, MD    Referring MD: Delilah Murray HERO., MD   Chief Complaint  Patient presents with   Follow-up    History of Present Illness:    Joseph Guerra is a 78 y.o. male past medical history significant for coronary artery disease in 2001 he suffered from myocardial infarction required stent to LAD, another event happened in 2019 he was also found to have ischemic cardiomyopathy ejection fraction 30% latest echocardiac showed improvement to 40%, he also was found to have carotic artery stenosis, COPD, however cardiac artery was not enough to intervene.  Comes today to my office for follow-up.  Looks like he was diagnosed with ALS obviously he and his family is devastated.  He looks much more fragile and his voice is clearly progressively getting worse.  He had difficulty speaking actually still I can communicate with him with some difficulties.  Cardiac wise denies have any chest pain tightness squeezing pressure burning chest.  Described the fact that he is weak tired exhausted does not do much.  Past Medical History:  Diagnosis Date   Arthritis associated with another disorder 12/17/2011   Atherosclerotic heart disease of native coronary artery without angina pectoris 12/17/2011   Formatting of this note might be different from the original. Myocardial infarction 2002 with apical hypokinesis Formatting of this note might be different from the original. Managed by Dr. Fitch   CAD (coronary artery disease)    a. prior MI in 2001 with stents placed at that time (patient unsure of which arteries)   Chronic dermatitis 10/24/2015   Collagenous colitis 10/16/2021   Coronary artery disease 12/17/2011   Overview:  Myocardial infarction 2002 with apical hypokinesis   Dyslipidemia (high LDL; low HDL) 06/18/2016   Essential hypertension  10/24/2015   History of Helicobacter pylori infection    Hypercholesteremia    Hyperlipidemia LDL goal <70    Hypertension    Hypokalemia    Ischemic cardiomyopathy    Ejection fraction 30% in June 2018   Knee pain, left 05/07/2016   PVC's (premature ventricular contractions) 03/28/2017   ST elevation myocardial infarction (STEMI) (HCC)    ST elevation myocardial infarction (STEMI) of anterolateral wall (HCC) 02/06/2017    Past Surgical History:  Procedure Laterality Date   CARDIOVERSION N/A 08/06/2023   Procedure: CARDIOVERSION (CATH LAB);  Surgeon: Raford Riggs, MD;  Location: [facility] INVASIVE CV LAB;  Service: Cardiovascular;  Laterality: N/A;   CORONARY ANGIOPLASTY WITH STENT PLACEMENT     CORONARY BALLOON ANGIOPLASTY N/A 02/06/2017   Procedure: Coronary Balloon Angioplasty;  Surgeon: Darron Deatrice LABOR, MD;  Location: [facility] INVASIVE CV LAB;  Service: Cardiovascular;  Laterality: N/A;   LEFT HEART CATH AND CORONARY ANGIOGRAPHY N/A 02/06/2017   Procedure: Left Heart Cath and Coronary Angiography;  Surgeon: Darron Deatrice LABOR, MD;  Location: [facility] INVASIVE CV LAB;  Service: Cardiovascular;  Laterality: N/A;   TRANSESOPHAGEAL ECHOCARDIOGRAM (CATH LAB) N/A 08/06/2023   Procedure: TRANSESOPHAGEAL ECHOCARDIOGRAM;  Surgeon: Raford Riggs, MD;  Location: [facility] INVASIVE CV LAB;  Service: Cardiovascular;  Laterality: N/A;    Current Medications: Current Meds  Medication Sig   atorvastatin  (LIPITOR ) 80 MG tablet Take 80 mg by mouth at bedtime.   Budeson-Glycopyrrol-Formoterol (BREZTRI  AEROSPHERE) 160-9-4.8 MCG/ACT AERO Inhale 2 puffs into the lungs in the morning and at bedtime.   carvedilol  (COREG ) 12.5  MG tablet TAKE 1 TABLET BY MOUTH TWICE DAILY WITH A MEAL   ELIQUIS 5 MG TABS tablet Take 5 mg by mouth 2 (two) times daily.   ferrous sulfate 325 (65 FE) MG tablet Take 325 mg by mouth 2 (two) times daily with a meal.   fluticasone  (FLONASE ) 50 MCG/ACT nasal spray Place 1 spray into both nostrils  daily.   furosemide  (LASIX ) 20 MG tablet Take 1 tablet (20 mg total) by mouth daily. (Patient taking differently: Take 20 mg by mouth as needed for edema.)   ipratropium-albuterol  (DUONEB) 0.5-2.5 (3) MG/3ML SOLN Inhale 3 mLs into the lungs every 6 (six) hours as needed (SOB).   isosorbide  mononitrate (IMDUR ) 30 MG 24 hr tablet Take 1 tablet (30 mg total) by mouth daily.   pantoprazole  (PROTONIX ) 40 MG tablet Take 1 tablet by mouth once daily   riluzole (RILUTEK) 50 MG tablet Take 50 mg by mouth every 12 (twelve) hours.   Spacer/Aero-Holding Chambers (AEROCHAMBER MV) inhaler Use as instructed   Vitamin D, Ergocalciferol, (DRISDOL) 1.25 MG (50000 UNIT) CAPS capsule Take 50,000 Units by mouth once a week.   [DISCONTINUED] aspirin  81 MG chewable tablet Chew 1 tablet (81 mg total) by mouth daily.     Allergies:   Patient has no known allergies.   Social History   Socioeconomic History   Marital status: Married    Spouse name: Not on file   Number of children: 2   Years of education: Not on file   Highest education level: Not on file  Occupational History   Occupation: retired  Tobacco Use   Smoking status: Former   Smokeless tobacco: Never  Advertising account planner   Vaping status: Never Used  Substance and Sexual Activity   Alcohol use: Yes    Comment: occasional. Glass of wine once in awhile   Drug use: No   Sexual activity: Not on file  Other Topics Concern   Not on file  Social History Narrative   Right Handed    Lives in a one story home with his wife.    Social Drivers of Corporate investment banker Strain: Not on file  Food Insecurity: Low Risk  (09/03/2023)   Received from [facility]   Hunger Vital Sign    Within the past 12 months, you worried that your food would run out before you got money to buy more: Never true    Within the past 12 months, the food you bought just didn't last and you didn't have money to get more. : Never true  Transportation Needs: No Transportation  Needs (09/03/2023)   Received from [employer]    In the past 12 months, has lack of reliable transportation kept you from medical appointments, meetings, work or from getting things needed for daily living? : No  Physical Activity: Not on file  Stress: Not on file  Social Connections: Not on file     Family History: The patient's family history includes Heart disease in his father. There is no history of Colon cancer or Stomach cancer. ROS:   Please see the history of present illness.    All 14 point review of systems negative except as described per history of present illness  EKGs/Labs/Other Studies Reviewed:         Recent Labs: 06/17/2023: NT-Pro BNP 765 07/09/2023: Platelets 153 08/06/2023: Hemoglobin 13.6 01/28/2024: BUN 15; Creatinine, Ser 0.71; Potassium 4.4; Sodium 141  Recent Lipid Panel    Component  Value Date/Time   CHOL 126 02/07/2023 0919   TRIG 62 02/07/2023 0919   HDL 69 02/07/2023 0919   CHOLHDL 1.8 02/07/2023 0919   CHOLHDL 3.0 02/07/2017 0236   VLDL 15 02/07/2017 0236   LDLCALC 44 02/07/2023 0919    Physical Exam:    VS:  BP 126/70 (BP Location: Left Arm, Patient Position: Sitting, Cuff Size: Normal)   Pulse 68   Resp 18   Ht 5' 9 (1.753 m)   Wt 149 lb 0.4 oz (67.6 kg)   SpO2 96%   BMI 22.01 kg/m     Wt Readings from Last 3 Encounters:  04/20/24 149 lb 0.4 oz (67.6 kg)  01/17/24 158 lb (71.7 kg)  12/04/23 166 lb (75.3 kg)     GEN:  Well nourished, well developed in no acute distress HEENT: Normal NECK: No JVD; No carotid bruits LYMPHATICS: No lymphadenopathy CARDIAC: RRR, no murmurs, no rubs, no gallops RESPIRATORY:  Clear to auscultation without rales, wheezing or rhonchi  ABDOMEN: Soft, non-tender, non-distended MUSCULOSKELETAL:  No edema; No deformity  SKIN: Warm and dry LOWER EXTREMITIES: no swelling NEUROLOGIC:  Alert and oriented x 3 PSYCHIATRIC:  Normal affect   ASSESSMENT:    1. Dyslipidemia (high LDL;  low HDL)   2. Atherosclerosis of native coronary artery of native heart without angina pectoris   3. Coronary artery disease involving native coronary artery of native heart without angina pectoris   4. Essential hypertension   5. Ischemic cardiomyopathy   6. Hyperlipidemia LDL goal <70    PLAN:    In order of problems listed above:  Coronary artery disease stable from that point review no events since 2019, therefore as per request by neurologist will discontinue his aspirin , will continue with Eliquis because of paroxysmal atrial fibrillation.  No recent events cardiac wise. Paroxysmal atrial fibrillation continue with anticoagulation. Ischemic cardiomyopathy improvement based on last echocardiogram.  Will continue present management, ALS new diagnosis obviously patient family is very concerned about it.   Medication Adjustments/Labs and Tests Ordered: Current medicines are reviewed at length with the patient today.  Concerns regarding medicines are outlined above.  Orders Placed This Encounter  Procedures   ALT   AST   Lipid panel   Medication changes: No orders of the defined types were placed in this encounter.   Signed, Lamar DOROTHA Fitch, MD, [facility] 04/20/2024 12:29 PM    [facility]

## 2024-04-20 NOTE — Patient Instructions (Signed)
 Medication Instructions:   STOP: Aspirin    Lab Work: 3rd Passenger transport manager  Your physician recommends that you return for lab work in:   when fasting You need to have labs done when you are fasting.  You can come Monday through Friday 8:00 am to 11:30AM and 1:00 to 4:00. You do not need to make an appointment as the order has already been placed.    Testing/Procedures: None Ordered   Follow-Up: At [facility], you and your health needs are our priority.  As part of our continuing mission to provide you with exceptional heart care, we have created designated Provider Care Teams.  These Care Teams include your primary Cardiologist (physician) and Advanced Practice Providers (APPs -  Physician Assistants and Nurse Practitioners) who all work together to provide you with the care you need, when you need it.  We recommend signing up for the patient portal called MyChart.  Sign up information is provided on this After Visit Summary.  MyChart is used to connect with patients for Virtual Visits (Telemedicine).  Patients are able to view lab/test results, encounter notes, upcoming appointments, etc.  Non-urgent messages can be sent to your provider as well.   To learn more about what you can do with MyChart, go to [email or web address].    Your next appointment:   4 month(s)  The format for your next appointment:   In Person  Provider:   Lamar Fitch, MD    Other Instructions NA

## 2024-04-23 LAB — LIPID PANEL
Chol/HDL Ratio: 1.9 ratio (ref 0.0–5.0)
Cholesterol, Total: 134 mg/dL (ref 100–199)
HDL: 71 mg/dL (ref >39)
LDL Chol Calc (NIH): 49 mg/dL (ref 0–99)
Triglycerides: 70 mg/dL (ref 0–149)
VLDL Cholesterol Cal: 14 mg/dL (ref 5–40)

## 2024-04-23 LAB — AST: AST: 35 IU/L (ref 0–40)

## 2024-04-23 LAB — ALT: ALT: 38 IU/L (ref 0–44)

## 2024-04-24 ENCOUNTER — Ambulatory Visit: Payer: Self-pay | Admitting: Cardiology

## 2024-04-29 ENCOUNTER — Telehealth: Payer: Self-pay

## 2024-04-29 NOTE — Telephone Encounter (Signed)
 Pt viewed lab results on My Chart per Dr. Karry note. Routed to PCP.

## 2024-04-29 NOTE — Telephone Encounter (Signed)
 Left message on My Chart with normal results per Dr. Karry note. Routed to PCP.

## 2024-05-11 ENCOUNTER — Other Ambulatory Visit: Payer: Self-pay | Admitting: Cardiology

## 2024-07-21 ENCOUNTER — Encounter: Payer: Self-pay | Admitting: Cardiology

## 2024-07-21 NOTE — Telephone Encounter (Signed)
 Daughter is calling to speak to a nurse about this.

## 2024-08-10 ENCOUNTER — Encounter: Payer: Self-pay | Admitting: *Deleted

## 2024-08-10 ENCOUNTER — Ambulatory Visit: Admitting: Cardiology

## 2024-11-03 ENCOUNTER — Ambulatory Visit: Admitting: Cardiology
